# Patient Record
Sex: Male | Born: 1965
Health system: Southern US, Community
[De-identification: ages and names within clinical notes are randomized; demographics above are authoritative.]

## PROBLEM LIST (undated history)

## (undated) ENCOUNTER — Emergency Department (HOSPITAL_COMMUNITY): Payer: No Typology Code available for payment source | Source: Home / Self Care

## (undated) DIAGNOSIS — I10 Essential (primary) hypertension: Secondary | ICD-10-CM

---

## 2004-06-04 ENCOUNTER — Emergency Department (HOSPITAL_COMMUNITY): Admission: EM | Admit: 2004-06-04 | Discharge: 2004-06-04 | Payer: Self-pay | Admitting: Emergency Medicine

## 2005-02-13 ENCOUNTER — Emergency Department (HOSPITAL_COMMUNITY): Admission: EM | Admit: 2005-02-13 | Discharge: 2005-02-13 | Payer: Self-pay | Admitting: Family Medicine

## 2009-01-09 ENCOUNTER — Inpatient Hospital Stay (HOSPITAL_COMMUNITY): Admission: EM | Admit: 2009-01-09 | Discharge: 2009-01-20 | Payer: Self-pay | Admitting: Emergency Medicine

## 2009-01-09 ENCOUNTER — Ambulatory Visit: Payer: Self-pay | Admitting: Family Medicine

## 2009-01-10 ENCOUNTER — Encounter (INDEPENDENT_AMBULATORY_CARE_PROVIDER_SITE_OTHER): Payer: Self-pay | Admitting: General Surgery

## 2009-01-10 ENCOUNTER — Ambulatory Visit: Payer: Self-pay | Admitting: Vascular Surgery

## 2009-01-15 LAB — CONVERTED CEMR LAB
Cholesterol: 145 mg/dL
LDL Cholesterol: 86 mg/dL
Total CHOL/HDL Ratio: 4.3
Triglycerides: 127 mg/dL
VLDL: 25 mg/dL

## 2009-01-18 LAB — CONVERTED CEMR LAB
Alkaline Phosphatase: 81 units/L
CO2: 32 meq/L
Creatinine, Ser: 1.43 mg/dL
Glucose, Bld: 100 mg/dL
Sodium: 136 meq/L
Total Bilirubin: 1.8 mg/dL
Total Protein: 6.4 g/dL

## 2009-01-22 ENCOUNTER — Encounter: Payer: Self-pay | Admitting: *Deleted

## 2009-01-22 ENCOUNTER — Ambulatory Visit: Payer: Self-pay | Admitting: Family Medicine

## 2009-01-27 ENCOUNTER — Ambulatory Visit: Payer: Self-pay | Admitting: Family Medicine

## 2009-01-27 DIAGNOSIS — I2699 Other pulmonary embolism without acute cor pulmonale: Secondary | ICD-10-CM | POA: Insufficient documentation

## 2009-01-27 DIAGNOSIS — I10 Essential (primary) hypertension: Secondary | ICD-10-CM | POA: Insufficient documentation

## 2009-01-27 LAB — CONVERTED CEMR LAB: INR: 1.1

## 2009-02-01 ENCOUNTER — Ambulatory Visit: Payer: Self-pay | Admitting: Family Medicine

## 2009-02-02 ENCOUNTER — Encounter: Payer: Self-pay | Admitting: Family Medicine

## 2009-02-02 ENCOUNTER — Telehealth: Payer: Self-pay | Admitting: *Deleted

## 2009-02-03 ENCOUNTER — Encounter: Payer: Self-pay | Admitting: *Deleted

## 2009-02-04 ENCOUNTER — Ambulatory Visit: Payer: Self-pay | Admitting: Family Medicine

## 2009-02-08 ENCOUNTER — Encounter: Payer: Self-pay | Admitting: Family Medicine

## 2009-02-08 ENCOUNTER — Ambulatory Visit: Payer: Self-pay | Admitting: Family Medicine

## 2009-02-08 ENCOUNTER — Encounter (INDEPENDENT_AMBULATORY_CARE_PROVIDER_SITE_OTHER): Payer: Self-pay | Admitting: Family Medicine

## 2009-02-09 ENCOUNTER — Encounter: Payer: Self-pay | Admitting: Family Medicine

## 2009-02-12 ENCOUNTER — Ambulatory Visit: Payer: Self-pay | Admitting: Family Medicine

## 2009-02-12 LAB — CONVERTED CEMR LAB: INR: 1.5

## 2009-02-15 ENCOUNTER — Encounter (INDEPENDENT_AMBULATORY_CARE_PROVIDER_SITE_OTHER): Payer: Self-pay | Admitting: Family Medicine

## 2009-02-17 ENCOUNTER — Ambulatory Visit: Payer: Self-pay | Admitting: Family Medicine

## 2009-02-17 ENCOUNTER — Encounter: Payer: Self-pay | Admitting: Pharmacist

## 2009-02-17 LAB — CONVERTED CEMR LAB: INR: 1.7

## 2009-02-22 ENCOUNTER — Ambulatory Visit: Payer: Self-pay | Admitting: Family Medicine

## 2009-02-24 ENCOUNTER — Ambulatory Visit: Payer: Self-pay | Admitting: Family Medicine

## 2009-02-24 LAB — CONVERTED CEMR LAB: INR: 2.9

## 2009-03-04 ENCOUNTER — Ambulatory Visit: Payer: Self-pay | Admitting: Family Medicine

## 2009-03-04 LAB — CONVERTED CEMR LAB: INR: 3.7

## 2009-03-10 ENCOUNTER — Ambulatory Visit: Payer: Self-pay | Admitting: Family Medicine

## 2009-03-10 LAB — CONVERTED CEMR LAB: INR: 3.2

## 2009-03-18 ENCOUNTER — Encounter (INDEPENDENT_AMBULATORY_CARE_PROVIDER_SITE_OTHER): Payer: Self-pay | Admitting: *Deleted

## 2009-03-18 ENCOUNTER — Ambulatory Visit: Payer: Self-pay | Admitting: Family Medicine

## 2009-03-24 ENCOUNTER — Encounter: Payer: Self-pay | Admitting: Family Medicine

## 2009-03-29 ENCOUNTER — Ambulatory Visit: Payer: Self-pay | Admitting: Family Medicine

## 2009-03-29 LAB — CONVERTED CEMR LAB: INR: 1.7

## 2009-04-12 ENCOUNTER — Encounter: Payer: Self-pay | Admitting: Family Medicine

## 2009-04-12 ENCOUNTER — Ambulatory Visit: Payer: Self-pay | Admitting: Family Medicine

## 2009-04-15 ENCOUNTER — Ambulatory Visit: Payer: Self-pay | Admitting: Family Medicine

## 2009-04-15 LAB — CONVERTED CEMR LAB: INR: 2.2

## 2009-04-22 ENCOUNTER — Ambulatory Visit: Payer: Self-pay | Admitting: Family Medicine

## 2009-05-10 ENCOUNTER — Ambulatory Visit: Payer: Self-pay | Admitting: Family Medicine

## 2009-05-10 LAB — CONVERTED CEMR LAB: INR: 1.5

## 2009-05-17 ENCOUNTER — Ambulatory Visit: Payer: Self-pay | Admitting: Family Medicine

## 2009-05-17 LAB — CONVERTED CEMR LAB: INR: 2.3

## 2009-05-31 ENCOUNTER — Ambulatory Visit: Payer: Self-pay | Admitting: Family Medicine

## 2009-05-31 LAB — CONVERTED CEMR LAB: INR: 2.2

## 2009-06-14 ENCOUNTER — Ambulatory Visit: Payer: Self-pay | Admitting: Family Medicine

## 2009-07-05 ENCOUNTER — Ambulatory Visit: Payer: Self-pay | Admitting: Family Medicine

## 2009-07-26 ENCOUNTER — Ambulatory Visit: Payer: Self-pay | Admitting: Family Medicine

## 2009-07-26 ENCOUNTER — Encounter: Payer: Self-pay | Admitting: Family Medicine

## 2009-07-26 LAB — CONVERTED CEMR LAB
BUN: 19 mg/dL (ref 6–23)
CO2: 27 meq/L (ref 19–32)
Chloride: 102 meq/L (ref 96–112)
INR: 4.62 — ABNORMAL HIGH (ref <1.50)
Potassium: 4 meq/L (ref 3.5–5.3)
Prothrombin Time: 43.3 s — ABNORMAL HIGH (ref 11.6–15.2)

## 2009-08-05 ENCOUNTER — Ambulatory Visit: Payer: Self-pay | Admitting: Family Medicine

## 2009-08-05 ENCOUNTER — Encounter: Payer: Self-pay | Admitting: Family Medicine

## 2009-08-05 LAB — CONVERTED CEMR LAB
HDL: 53 mg/dL (ref >39)
LDL Cholesterol: 98 mg/dL (ref 0–99)
Triglycerides: 77 mg/dL (ref <150)
VLDL: 15 mg/dL (ref 0–40)

## 2010-05-19 ENCOUNTER — Ambulatory Visit: Payer: Self-pay | Admitting: Family Medicine

## 2010-05-19 ENCOUNTER — Encounter: Payer: Self-pay | Admitting: Family Medicine

## 2010-05-19 LAB — CONVERTED CEMR LAB
Calcium: 9.3 mg/dL (ref 8.4–10.5)
Creatinine, Ser: 1.42 mg/dL (ref 0.40–1.50)
Sodium: 142 meq/L (ref 135–145)

## 2010-05-20 ENCOUNTER — Encounter: Payer: Self-pay | Admitting: Family Medicine

## 2010-09-27 NOTE — Letter (Signed)
Summary: Out of School  [facility]  [address]   [place], [place] [address]   Phone: [phone]  Fax: [phone]    May 19, 2010   Student:  [employer]    To Whom It May Concern:   For Medical reasons, please excuse the above named student from school for the following dates:  Start:   May 19, 2010  End:    May return to school on Monday, Sept 26, 2011.  If you need additional information, please feel free to contact our office.   Sincerely,    Cat Ta MD    ****This is a legal document and cannot be tampered with.  Schools are authorized to verify all information and to do so accordingly.
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
## 2010-09-27 NOTE — Letter (Signed)
Summary: Generic Letter  [facility]  [address]   [place], [place] [address]   Phone: [phone]  Fax: [phone]    05/20/2010  [facility] [address] [place], [place]  [address]  Dear [name],  I tried to call you by phone but the phone number we have for you was disconnected.  Please call the [facility] with your new number.    I wanted to let you know that the blood test we took yesterday was normal (electrolytes and kidney function).    Continue taking your 2 blood pressure medicines Hydrochlorothiazide and Amlodipine.       Sincerely,   Erik Hilmer MD  Appended Document: Generic Letter mailed.

## 2010-11-15 ENCOUNTER — Other Ambulatory Visit: Payer: Self-pay | Admitting: Family Medicine

## 2010-12-06 LAB — CBC
HCT: 33.3 % — ABNORMAL LOW (ref 39.0–52.0)
HCT: 33.6 % — ABNORMAL LOW (ref 39.0–52.0)
HCT: 34.7 % — ABNORMAL LOW (ref 39.0–52.0)
HCT: 35.7 % — ABNORMAL LOW (ref 39.0–52.0)
HCT: 35.8 % — ABNORMAL LOW (ref 39.0–52.0)
HCT: 37 % — ABNORMAL LOW (ref 39.0–52.0)
HCT: 37.2 % — ABNORMAL LOW (ref 39.0–52.0)
HCT: 37.4 % — ABNORMAL LOW (ref 39.0–52.0)
HCT: 37.5 % — ABNORMAL LOW (ref 39.0–52.0)
HCT: 37.6 % — ABNORMAL LOW (ref 39.0–52.0)
HCT: 41 % (ref 39.0–52.0)
HCT: 41.9 % (ref 39.0–52.0)
HCT: 37.8 % — ABNORMAL LOW (ref 39.0–52.0)
Hemoglobin: 11.4 g/dL — ABNORMAL LOW (ref 13.0–17.0)
Hemoglobin: 11.7 g/dL — ABNORMAL LOW (ref 13.0–17.0)
Hemoglobin: 11.8 g/dL — ABNORMAL LOW (ref 13.0–17.0)
Hemoglobin: 12.4 g/dL — ABNORMAL LOW (ref 13.0–17.0)
Hemoglobin: 12.5 g/dL — ABNORMAL LOW (ref 13.0–17.0)
Hemoglobin: 12.6 g/dL — ABNORMAL LOW (ref 13.0–17.0)
Hemoglobin: 12.7 g/dL — ABNORMAL LOW (ref 13.0–17.0)
Hemoglobin: 12.8 g/dL — ABNORMAL LOW (ref 13.0–17.0)
Hemoglobin: 12.9 g/dL — ABNORMAL LOW (ref 13.0–17.0)
Hemoglobin: 14 g/dL (ref 13.0–17.0)
Hemoglobin: 14.1 g/dL (ref 13.0–17.0)
Hemoglobin: 12.7 g/dL — ABNORMAL LOW (ref 13.0–17.0)
Hemoglobin: 13.3 g/dL (ref 13.0–17.0)
MCHC: 33.6 g/dL (ref 30.0–36.0)
MCHC: 34 g/dL (ref 30.0–36.0)
MCHC: 34.1 g/dL (ref 30.0–36.0)
MCHC: 34.2 g/dL (ref 30.0–36.0)
MCHC: 34.2 g/dL (ref 30.0–36.0)
MCHC: 34.2 g/dL (ref 30.0–36.0)
MCHC: 34.3 g/dL (ref 30.0–36.0)
MCHC: 34.5 g/dL (ref 30.0–36.0)
MCHC: 34.6 g/dL (ref 30.0–36.0)
MCHC: 34.7 g/dL (ref 30.0–36.0)
MCHC: 34.9 g/dL (ref 30.0–36.0)
MCHC: 33.6 g/dL (ref 30.0–36.0)
MCV: 83 fL (ref 78.0–100.0)
MCV: 83.2 fL (ref 78.0–100.0)
MCV: 83.3 fL (ref 78.0–100.0)
MCV: 83.6 fL (ref 78.0–100.0)
MCV: 83.8 fL (ref 78.0–100.0)
MCV: 83.9 fL (ref 78.0–100.0)
MCV: 83.9 fL (ref 78.0–100.0)
MCV: 84 fL (ref 78.0–100.0)
MCV: 84.2 fL (ref 78.0–100.0)
MCV: 84.2 fL (ref 78.0–100.0)
MCV: 84.4 fL (ref 78.0–100.0)
MCV: 84.6 fL (ref 78.0–100.0)
MCV: 83.7 fL (ref 78.0–100.0)
MCV: 84.4 fL (ref 78.0–100.0)
Platelets: 102 10*3/uL — ABNORMAL LOW (ref 150–400)
Platelets: 145 10*3/uL — ABNORMAL LOW (ref 150–400)
Platelets: 162 10*3/uL (ref 150–400)
Platelets: 197 10*3/uL (ref 150–400)
Platelets: 199 10*3/uL (ref 150–400)
Platelets: 215 10*3/uL (ref 150–400)
Platelets: 217 10*3/uL (ref 150–400)
Platelets: 89 10*3/uL — ABNORMAL LOW (ref 150–400)
Platelets: 96 10*3/uL — ABNORMAL LOW (ref 150–400)
RBC: 3.95 MIL/uL — ABNORMAL LOW (ref 4.22–5.81)
RBC: 4.11 MIL/uL — ABNORMAL LOW (ref 4.22–5.81)
RBC: 4.31 MIL/uL (ref 4.22–5.81)
RBC: 4.32 MIL/uL (ref 4.22–5.81)
RBC: 4.38 MIL/uL (ref 4.22–5.81)
RBC: 4.4 MIL/uL (ref 4.22–5.81)
RBC: 4.44 MIL/uL (ref 4.22–5.81)
RBC: 4.44 MIL/uL (ref 4.22–5.81)
RBC: 4.51 MIL/uL (ref 4.22–5.81)
RBC: 4.53 MIL/uL (ref 4.22–5.81)
RBC: 4.48 MIL/uL (ref 4.22–5.81)
RBC: 4.67 MIL/uL (ref 4.22–5.81)
RDW: 11.7 % (ref 11.5–15.5)
RDW: 11.9 % (ref 11.5–15.5)
RDW: 12 % (ref 11.5–15.5)
RDW: 12 % (ref 11.5–15.5)
RDW: 12.1 % (ref 11.5–15.5)
RDW: 12.3 % (ref 11.5–15.5)
RDW: 12.3 % (ref 11.5–15.5)
RDW: 12.4 % (ref 11.5–15.5)
RDW: 12.5 % (ref 11.5–15.5)
RDW: 12.6 % (ref 11.5–15.5)
RDW: 12.8 % (ref 11.5–15.5)
RDW: 12.2 % (ref 11.5–15.5)
WBC: 5.3 10*3/uL (ref 4.0–10.5)
WBC: 5.8 10*3/uL (ref 4.0–10.5)
WBC: 5.9 10*3/uL (ref 4.0–10.5)
WBC: 6.1 10*3/uL (ref 4.0–10.5)
WBC: 6.2 10*3/uL (ref 4.0–10.5)
WBC: 6.4 10*3/uL (ref 4.0–10.5)
WBC: 6.6 10*3/uL (ref 4.0–10.5)
WBC: 6.8 10*3/uL (ref 4.0–10.5)
WBC: 7 10*3/uL (ref 4.0–10.5)
WBC: 14.1 10*3/uL — ABNORMAL HIGH (ref 4.0–10.5)

## 2010-12-06 LAB — DIFFERENTIAL
Lymphs Abs: 1 10*3/uL (ref 0.7–4.0)
Monocytes Absolute: 0.5 10*3/uL (ref 0.1–1.0)
Monocytes Relative: 4 % (ref 3–12)
Neutro Abs: 12.5 10*3/uL — ABNORMAL HIGH (ref 1.7–7.7)
Neutrophils Relative %: 89 % — ABNORMAL HIGH (ref 43–77)

## 2010-12-06 LAB — COMPREHENSIVE METABOLIC PANEL
Albumin: 3.2 g/dL — ABNORMAL LOW (ref 3.5–5.2)
BUN: 6 mg/dL (ref 6–23)
Calcium: 8.5 mg/dL (ref 8.4–10.5)
Chloride: 101 mEq/L (ref 96–112)
Creatinine, Ser: 1.03 mg/dL (ref 0.4–1.5)
GFR calc non Af Amer: >60 mL/min (ref >60)
Total Bilirubin: 1.8 mg/dL — ABNORMAL HIGH (ref 0.3–1.2)

## 2010-12-06 LAB — LIPID PANEL: VLDL: 25 mg/dL (ref 0–40)

## 2010-12-06 LAB — LUPUS ANTICOAGULANT PANEL: PTTLA 4:1 Mix: 53.7 secs — ABNORMAL HIGH (ref 36.3–48.8)

## 2010-12-06 LAB — BASIC METABOLIC PANEL
BUN: 16 mg/dL (ref 6–23)
CO2: 28 mEq/L (ref 19–32)
CO2: 32 mEq/L (ref 19–32)
Calcium: 8.8 mg/dL (ref 8.4–10.5)
Calcium: 9 mg/dL (ref 8.4–10.5)
Calcium: 8.5 mg/dL (ref 8.4–10.5)
Chloride: 109 mEq/L (ref 96–112)
Chloride: 96 mEq/L (ref 96–112)
Chloride: 109 mEq/L (ref 96–112)
Creatinine, Ser: 1.27 mg/dL (ref 0.4–1.5)
Creatinine, Ser: 1.62 mg/dL — ABNORMAL HIGH (ref 0.4–1.5)
Creatinine, Ser: 1.15 mg/dL (ref 0.4–1.5)
GFR calc Af Amer: 57 mL/min — ABNORMAL LOW (ref >60)
GFR calc Af Amer: >60 mL/min (ref >60)
GFR calc Af Amer: >60 mL/min (ref >60)
GFR calc Af Amer: >60 mL/min (ref >60)
GFR calc non Af Amer: 55 mL/min — ABNORMAL LOW (ref >60)
GFR calc non Af Amer: >60 mL/min (ref >60)
Glucose, Bld: 100 mg/dL — ABNORMAL HIGH (ref 70–99)
Glucose, Bld: 102 mg/dL — ABNORMAL HIGH (ref 70–99)
Potassium: 3.4 mEq/L — ABNORMAL LOW (ref 3.5–5.1)
Potassium: 4 mEq/L (ref 3.5–5.1)
Sodium: 136 mEq/L (ref 135–145)
Sodium: 138 mEq/L (ref 135–145)
Sodium: 141 mEq/L (ref 135–145)

## 2010-12-06 LAB — PROTEIN S ACTIVITY: Protein S Activity: 97 % (ref 69–129)

## 2010-12-06 LAB — BETA-2-GLYCOPROTEIN I ABS, IGG/M/A
Beta-2 Glyco I IgG: <4 U/mL (ref <20)
Beta-2-Glycoprotein I IgA: 4 U/mL (ref <10)

## 2010-12-06 LAB — PROTIME-INR
INR: 1.1 (ref 0.00–1.49)
INR: 2.1 — ABNORMAL HIGH (ref 0.00–1.49)
INR: 2.3 — ABNORMAL HIGH (ref 0.00–1.49)
Prothrombin Time: 15.8 seconds — ABNORMAL HIGH (ref 11.6–15.2)
Prothrombin Time: 19.2 seconds — ABNORMAL HIGH (ref 11.6–15.2)
Prothrombin Time: 24.6 seconds — ABNORMAL HIGH (ref 11.6–15.2)

## 2010-12-06 LAB — AMMONIA: Ammonia: 22 umol/L (ref 11–35)

## 2010-12-06 LAB — FACTOR 5 LEIDEN

## 2010-12-06 LAB — CARDIOLIPIN ANTIBODIES, IGG, IGM, IGA: Anticardiolipin IgG: <7 [GPL'U] — ABNORMAL LOW (ref <11)

## 2010-12-06 LAB — ETHANOL: Alcohol, Ethyl (B): 140 mg/dL — ABNORMAL HIGH (ref 0–10)

## 2010-12-06 LAB — ANTITHROMBIN III: AntiThromb III Func: 124 % — ABNORMAL HIGH (ref 76–126)
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
## 2012-03-25 ENCOUNTER — Telehealth: Payer: Self-pay | Admitting: Family Medicine

## 2012-03-25 NOTE — Telephone Encounter (Signed)
Pt is out of BP meds. Has an appt to meet new doc on 04/05/12. Needs enough to take until appt.

## 2012-03-25 NOTE — Telephone Encounter (Signed)
05/19/2010 was last visit in [facility].  Patient was only given a 4 month supply at that time and as far as I can tell has had no other refills.  Please call pt and ask if he was being seen by a different provider in the interim (since 2011).  If so, I will fill x11 days so he does not run out, otherwise, I will fill at his appt on 8/9. I doubt that he has had medications in >1 year Margaretmary Bayley has had a different PCP.  Thanks!

## 2012-03-25 NOTE — Telephone Encounter (Signed)
Forward to PCP for refill request, I do not see where patient has been seen here before.Busick, Rodena Medin

## 2012-03-26 NOTE — Telephone Encounter (Signed)
Spoke with Dr Fara Boros she says patient needs to keep his appointment with her since he has not been seen here in a couple years and the BP med was last filled in Feb of 2012. Called and informed patient of this and also to go to a pharmacy to have his BP checked to make sure it is not to elevated, if so to let us know. Patient agreed with this.Busick, Rodena Medin

## 2012-04-05 ENCOUNTER — Ambulatory Visit (INDEPENDENT_AMBULATORY_CARE_PROVIDER_SITE_OTHER): Payer: 59 | Admitting: Family Medicine

## 2012-04-05 ENCOUNTER — Encounter: Payer: Self-pay | Admitting: Family Medicine

## 2012-04-05 VITALS — BP 158/106 | HR 72 | Ht 73.5 in | Wt 253.7 lb

## 2012-04-05 DIAGNOSIS — I10 Essential (primary) hypertension: Secondary | ICD-10-CM

## 2012-04-05 DIAGNOSIS — Z23 Encounter for immunization: Secondary | ICD-10-CM

## 2012-04-05 NOTE — Progress Notes (Signed)
S: Pt comes in today for follow up, med refill.  HYPERTENSION BP: 128/106 Meds: amlodipine 5mg , HCTZ 25mg  Taking meds: No    # of doses missed/week: all; has been out x a few weeks Symptoms: Headache: No Dizziness: No Vision changes: No SOB:  No Chest pain: No LE swelling: No Tobacco use: No  HEADACHE Had a headache a few weeks ago, is thinking it was b/c of his BP.  No problems since then.  Didn't have dizziness or syncope.  No other symptoms (no weakness, numbness, etc).    ROS: Per HPI  History  Smoking status  . Never Smoker   Smokeless tobacco  . Not on file    O:  Filed Vitals:   04/05/12 1551  BP: 158/106  Pulse: 72    Gen: NAD CV: RRR, no murmur Pulm: CTA bilat, no wheezes or crackles Abd: soft, NT Ext: Warm, no chronic skin changes, no edema   A/P: 46 y.o. male p/w HTN -See problem list -f/u in 1 month

## 2012-04-05 NOTE — Patient Instructions (Addendum)
It was nice to meet you.  We will check a lab today, to make sure your kidneys are working well.  We will restart one of your medicines first (HCTZ).  Come back in a few weeks (3-4) and we will recheck your blood pressure; if it's still high we will readd the norvasc.

## 2012-04-06 ENCOUNTER — Encounter: Payer: Self-pay | Admitting: Family Medicine

## 2012-04-06 LAB — BASIC METABOLIC PANEL
BUN: 15 mg/dL (ref 6–23)
Calcium: 9.3 mg/dL (ref 8.4–10.5)
Creat: 1.5 mg/dL — ABNORMAL HIGH (ref 0.50–1.35)
Glucose, Bld: 93 mg/dL (ref 70–99)

## 2012-04-06 MED ORDER — HYDROCHLOROTHIAZIDE 25 MG PO TABS: 25.0000 mg | ORAL_TABLET | Freq: Every day | ORAL | Status: DC

## 2012-04-06 NOTE — Assessment & Plan Note (Signed)
Elevated today. Will restart HCTZ. Check BMET.  Was also on norvasc previously, so could restart if need additional BP control.  BP Readings from Last 3 Encounters:  04/05/12 158/106  05/19/10 153/102  07/26/09 147/97    04/06/12-- BMET resulted, Cr slightly elevated.  Called pt and advised him to stop all NSAIDs and increase po fluids.  Will recheck BMET at f/u visit.

## 2012-05-09 ENCOUNTER — Encounter: Payer: Self-pay | Admitting: Family Medicine

## 2012-05-09 ENCOUNTER — Ambulatory Visit (INDEPENDENT_AMBULATORY_CARE_PROVIDER_SITE_OTHER): Payer: 59 | Admitting: Family Medicine

## 2012-05-09 VITALS — BP 140/89 | HR 84 | Ht 73.5 in | Wt 253.0 lb

## 2012-05-09 DIAGNOSIS — N529 Male erectile dysfunction, unspecified: Secondary | ICD-10-CM | POA: Insufficient documentation

## 2012-05-09 DIAGNOSIS — I10 Essential (primary) hypertension: Secondary | ICD-10-CM

## 2012-05-09 DIAGNOSIS — N179 Acute kidney failure, unspecified: Secondary | ICD-10-CM

## 2012-05-09 LAB — BASIC METABOLIC PANEL
CO2: 25 mEq/L (ref 19–32)
Chloride: 105 mEq/L (ref 96–112)
Potassium: 4 mEq/L (ref 3.5–5.3)
Sodium: 140 mEq/L (ref 135–145)

## 2012-05-09 MED ORDER — SILDENAFIL CITRATE 25 MG PO TABS: 25.0000 mg | ORAL_TABLET | Freq: Every day | ORAL | Status: DC | PRN

## 2012-05-09 NOTE — Assessment & Plan Note (Signed)
Technically, BP right around goal of <140/90.  If Cr remains elevated, new goal would be <130/80.  Pt reports some non-compliance with HCTZ and no salt restriction.  Would like to try 3-4 weeks of med compliance and dietary changes before re-adding amlodipine 5mg .

## 2012-05-09 NOTE — Assessment & Plan Note (Signed)
Will recheck BMET today and see if Cr has improved.  Appears to have worsened over time (1.50 on 03/2012, 1.42 in 04/2010, 1.20 in 06/2009).  This may actually be CKD.

## 2012-05-09 NOTE — Assessment & Plan Note (Signed)
Pt requesting medical assistance in getting erection.  Only significant cardiac history is HTN.  Will try Viagra.

## 2012-05-09 NOTE — Progress Notes (Signed)
S: Pt comes in today for follow up.  HYPERTENSION BP: 140/89 Meds: HCTZ 25 (previously on amlodipine 5, not yet restarted) Taking meds: Yes     # of doses missed/week: 3 Symptoms: Headache: No Dizziness: No Vision changes: No SOB:  No Chest pain: Yes : MSK LE swelling: No Tobacco use: No   ELEVATED CR/AKI Found on labs.  Encouraged pt to avoid NSAIDs and increase po fluids, which he has done.  No issues with urination.   ERECTILE DYSFUNCTION Has been an issue for months.  Having difficulty with getting erection started but once has one, he is able to maintain it and it is good enough for penetration.  Is interested in sexual activity.  Would like to know if something like Viagra would be appropriate for him.    ROS: Per HPI  History  Smoking status  . Never Smoker   Smokeless tobacco  . Not on file    O:  Filed Vitals:   05/09/12 1612  BP: 140/89  Pulse: 84    Gen: NAD CV: RRR, no murmur Pulm: CTA bilat, no wheezes or crackles Ext: Warm, no edema   A/P: 46 y.o. male p/w HTN, AKI, ED -See problem list -f/u in 3-4 weeks for BP recheck

## 2012-05-09 NOTE — Patient Instructions (Addendum)
It was good to see you today.  Your blood pressure is right around our goal of less than 140/90 (it was 140/89 today) on just the HCTZ.  We decided to try to be better about taking the medicine every day and avoiding salt before re-adding amlodipine.   We are rechecking your kidneys today since they were not working at 100% on the last lab check.  I will let you know the results.  I have sent in a prescription for Viagra.  Take 1 pill before sexual activity.  If 1 is not enough after trying it 1-2 times, you can try 2 at a time.   Come back and see me in about 3-4 weeks so we can recheck your blood pressure.

## 2012-05-10 ENCOUNTER — Telehealth: Payer: Self-pay | Admitting: Family Medicine

## 2012-05-10 DIAGNOSIS — I10 Essential (primary) hypertension: Secondary | ICD-10-CM

## 2012-05-10 DIAGNOSIS — N179 Acute kidney failure, unspecified: Secondary | ICD-10-CM

## 2012-05-10 NOTE — Telephone Encounter (Signed)
Called patient and read message from Dr Fara Boros. Scheduled him a lab appointment for 05/16/12.Brandilynn Taormina, Rodena Medin

## 2012-05-10 NOTE — Telephone Encounter (Signed)
Please call patient and ask him to come in for a LAB ONLY appointment next week for a urine and blood sample.  His kidney function did not improve despite encouraging him to avoid NSAIDs and drink plenty of water and with his blood pressure doing better.  It actually worsened.  Please tell him to continue taking his BP medication and to continue to stay well hydrated and avoid NSAIDs (including motrin, Aleve, goody's powder).  Please double check that he is peeing ok (I asked him yesterday and he reported no problems). Thanks!

## 2012-05-15 ENCOUNTER — Other Ambulatory Visit: Payer: 59

## 2012-05-15 DIAGNOSIS — N179 Acute kidney failure, unspecified: Secondary | ICD-10-CM

## 2012-05-15 DIAGNOSIS — I10 Essential (primary) hypertension: Secondary | ICD-10-CM

## 2012-05-15 LAB — POCT URINALYSIS DIPSTICK
Bilirubin, UA: NEGATIVE
Blood, UA: NEGATIVE
Glucose, UA: NEGATIVE
Nitrite, UA: NEGATIVE
Spec Grav, UA: >=1.03

## 2012-05-15 LAB — BASIC METABOLIC PANEL
BUN: 20 mg/dL (ref 6–23)
CO2: 29 mEq/L (ref 19–32)
Chloride: 104 mEq/L (ref 96–112)
Glucose, Bld: 96 mg/dL (ref 70–99)
Potassium: 3.9 mEq/L (ref 3.5–5.3)

## 2012-05-15 NOTE — Progress Notes (Signed)
BMP AND UA DONE TODAY [name] 

## 2012-05-16 ENCOUNTER — Other Ambulatory Visit: Payer: 59

## 2012-05-16 ENCOUNTER — Encounter: Payer: Self-pay | Admitting: Family Medicine

## 2012-06-12 ENCOUNTER — Ambulatory Visit (INDEPENDENT_AMBULATORY_CARE_PROVIDER_SITE_OTHER): Payer: 59 | Admitting: Family Medicine

## 2012-06-12 ENCOUNTER — Encounter: Payer: Self-pay | Admitting: Family Medicine

## 2012-06-12 VITALS — BP 122/88 | HR 76 | Ht 73.5 in | Wt 250.0 lb

## 2012-06-12 DIAGNOSIS — M7710 Lateral epicondylitis, unspecified elbow: Secondary | ICD-10-CM

## 2012-06-12 DIAGNOSIS — I10 Essential (primary) hypertension: Secondary | ICD-10-CM

## 2012-06-12 DIAGNOSIS — N179 Acute kidney failure, unspecified: Secondary | ICD-10-CM

## 2012-06-12 LAB — BASIC METABOLIC PANEL
BUN: 16 mg/dL (ref 6–23)
Glucose, Bld: 90 mg/dL (ref 70–99)
Potassium: 4.1 mEq/L (ref 3.5–5.3)

## 2012-06-12 NOTE — Assessment & Plan Note (Signed)
May have component of CKD given patient's long-standing, poorly controlled HTN.  Cr trend: 04/2010: 1.42 04/05/12: 1.50 HCTZ restarted 05/09/12: 1.65 05/15/12: 1.48; UA neg for prot, RBC, WBC  Recheck BMET today

## 2012-06-12 NOTE — Assessment & Plan Note (Addendum)
On HCTZ 25, reports compliance to med and trying to salt restrict diet. BP well controlled today, no changes. F/u 1 month.  If remains improved will space to q3 mo visits; if elevated will add back norvasc 5mg .

## 2012-06-12 NOTE — Assessment & Plan Note (Signed)
D/w Dr. Jennette Kettle. Will send to [facility] for U/S +/- injection.  Aspercreme PRN until appt.

## 2012-06-12 NOTE — Patient Instructions (Addendum)
It was good to see you today.  We're checking your kidney function again.  It had improved last time; hopefully it will continue to improve or at least stay stable.  I will call if it is worse.  Continue to drink plenty of water and avoid medicines like motrin, Aleve, Goody powder, etc.   Your blood pressure looks great! No changes!  Make sure you are taking your HCTZ every day.  You can use some Aspercreme (over the counter) on that elbow until you are seen by Sports Medicine.  Make an appointment so that they can look at your elbow with the ultrasound- you can make it up front on your way out or call [phone].    Come back to see me in 1 month.  Once your blood pressure has looked good a few times in a row you won't need to come as often.

## 2012-06-12 NOTE — Progress Notes (Signed)
S: Pt comes in today for follow up.  HYPERTENSION BP: 122/88 Meds: HCTZ 25mg  (previously on norvasc 5) Taking meds: Yes     # of doses missed/week: 0 Symptoms: Headache: Yes- 1 a few weeks ago Dizziness: No Vision changes: No SOB:  No Chest pain: No LE swelling: No Tobacco use: No Diet: decreasing amount of salt he's using it   AKI May have component of CKD given patient's long-standing, poorly controlled HTN.  Reports no issues with urination, no obstructive symptoms. No gross hematuria or dysuria.  Cr trend: 04/2010: 1.42 04/05/12: 1.50 HCTZ restarted 05/09/12: 1.65 05/15/12: 1.48; UA neg for prot, RBC, WBC   ELBOW PAIN  Now with tenderness to palpation over the bone.  Sometimes wakes up with pain across his antecubital fossa. No activity seems to make the pain worse. No redness or swelling. No fevers. No tenderness other than over one pinpoint spot.  Feels like it is slowly worsening. No weakness in arm or hand. 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    ROS: Per HPI  History  Smoking status  . Never Smoker   Smokeless tobacco  . Not on file    O:  Filed Vitals:   06/12/12 1643  BP: 122/88  Pulse: 76    Gen: NAD CV: RRR, no murmur Pulm: CTA bilat, no wheezes or crackles Ext: Warm, no edema L elbow: no edema or erythema, + TTP over lateral epicondyle, no other tenderness, full ROM   A/P: 46 y.o. male p/w HTN, AKI vs CKD, lateral epicondylitis  -See problem list -f/u in 1 month

## 2012-06-13 ENCOUNTER — Encounter: Payer: Self-pay | Admitting: Family Medicine

## 2012-11-15 ENCOUNTER — Other Ambulatory Visit: Payer: Self-pay | Admitting: Family Medicine

## 2012-11-15 NOTE — Telephone Encounter (Signed)
NO further refills until has appt with PCP.

## 2012-11-20 ENCOUNTER — Other Ambulatory Visit: Payer: Self-pay | Admitting: Family Medicine

## 2012-11-20 NOTE — Telephone Encounter (Signed)
Willing to refill once pt schedules an appt for f/u.  Please call pt and try to get him in to see me.  Then I will refill HCTZ.  I have not seen him since 05/2012 and he had some kidney damage at that time that needs to be followed closely with the blood pressure medicine he is on.

## 2012-11-21 NOTE — Telephone Encounter (Signed)
Will forward to Dr McGill 

## 2012-11-21 NOTE — Telephone Encounter (Signed)
LMOM advising pt to rt call to sched OV and let us know so that Dr Fara Boros can RF med.

## 2012-11-21 NOTE — Telephone Encounter (Signed)
Pt has made appt for next Thurs - wants to know if he can get refill on his BP meds until then

## 2012-11-21 NOTE — Telephone Encounter (Signed)
Advised pt of med at pharmacy per Dr Fara Boros.

## 2012-11-28 ENCOUNTER — Encounter: Payer: Self-pay | Admitting: Family Medicine

## 2012-11-28 ENCOUNTER — Ambulatory Visit (INDEPENDENT_AMBULATORY_CARE_PROVIDER_SITE_OTHER): Payer: 59 | Admitting: Family Medicine

## 2012-11-28 VITALS — BP 145/87 | HR 81 | Ht 73.5 in | Wt 255.0 lb

## 2012-11-28 DIAGNOSIS — N189 Chronic kidney disease, unspecified: Secondary | ICD-10-CM | POA: Insufficient documentation

## 2012-11-28 DIAGNOSIS — I10 Essential (primary) hypertension: Secondary | ICD-10-CM

## 2012-11-28 MED ORDER — HYDROCHLOROTHIAZIDE 25 MG PO TABS: ORAL_TABLET | ORAL | Status: DC

## 2012-11-28 NOTE — Assessment & Plan Note (Signed)
Defer BMET to next month per pt request, anticipate Cr to be at baseline of 1.5

## 2012-11-28 NOTE — Assessment & Plan Note (Signed)
Elevated today but out of med x1 week. Refilled HCTZ 25mg .  F/u 1 month.  If still elevated, will readd norvasc 5mg , otherwise will space visits.  BP Readings from Last 3 Encounters:  11/28/12 145/87  06/12/12 122/88  05/09/12 140/89

## 2012-11-28 NOTE — Patient Instructions (Addendum)
It was good to see you today.  I am refilling your blood pressure medicine.  I will see you back in 1 month, and we will check your kidney function then.

## 2012-11-28 NOTE — Progress Notes (Signed)
S: Pt comes in today for follow up.  HYPERTENSION BP: 145/87 Meds: HCTZ 25 (previously also on norvasc 5) Taking meds: Yes : but has been out for 1 week    # of doses missed/week: 0 Symptoms: Headache: No Dizziness: No Vision changes: No SOB:  No Chest pain: No LE swelling: No Tobacco use: No   CKD: baseline Cr ~ 1.4-1.5; most recently 1.43 on 05/2012 with calculated eGFR 69.    ROS: Per HPI  History  Smoking status  . Never Smoker   Smokeless tobacco  . Not on file    O:  Filed Vitals:   11/28/12 1532  BP: 145/87  Pulse: 81    Gen: NAD CV: RRR, no murmur Pulm: CTA bilat, no wheezes or crackles Ext: Warm, no edema   A/P: 47 y.o. male p/w HTN, CKD -See problem list -f/u in 1 month

## 2012-12-30 ENCOUNTER — Ambulatory Visit (INDEPENDENT_AMBULATORY_CARE_PROVIDER_SITE_OTHER): Payer: 59 | Admitting: Family Medicine

## 2012-12-30 ENCOUNTER — Encounter: Payer: Self-pay | Admitting: Family Medicine

## 2012-12-30 VITALS — BP 134/84 | HR 84 | Ht 73.5 in | Wt 249.0 lb

## 2012-12-30 DIAGNOSIS — N189 Chronic kidney disease, unspecified: Secondary | ICD-10-CM

## 2012-12-30 DIAGNOSIS — I10 Essential (primary) hypertension: Secondary | ICD-10-CM

## 2012-12-30 LAB — BASIC METABOLIC PANEL
BUN: 21 mg/dL (ref 6–23)
Calcium: 9.7 mg/dL (ref 8.4–10.5)
Glucose, Bld: 83 mg/dL (ref 70–99)
Sodium: 140 mEq/L (ref 135–145)

## 2012-12-30 MED ORDER — HYDROCHLOROTHIAZIDE 25 MG PO TABS: ORAL_TABLET | ORAL | Status: DC

## 2012-12-30 NOTE — Assessment & Plan Note (Signed)
Well controlled back on HCTZ 25. Check BMET for Cr.

## 2012-12-30 NOTE — Progress Notes (Signed)
S: Pt comes in today for follow up.  HYPERTENSION BP: 134/84 Meds: HCTZ 25 (previously also on norvasc 5) Taking meds: Yes     # of doses missed/week: 0 Symptoms: Headache: Yes : occasional- one every 1-2 weeks Dizziness: No Vision changes: No SOB:  No Chest pain: No LE swelling: No Tobacco use: No    ROS: Per HPI  History  Smoking status  . Never Smoker   Smokeless tobacco  . Not on file    O:  Filed Vitals:   12/30/12 1622  BP: 134/84  Pulse: 84    Gen: NAD CV: RRR, no murmur Pulm: CTA bilat, no wheezes or crackles Ext: Warm,no edema   A/P: 47 y.o. male p/w HTN, CKD -See problem list -f/u in 3-6 months

## 2012-12-30 NOTE — Patient Instructions (Signed)
It was good to see you today.   Your blood pressure looks better, so no changes to your medicine.  We are checking your kidneys today-- I'll send you a letter with the results.  Come back in 3-6 months.

## 2012-12-31 ENCOUNTER — Encounter: Payer: Self-pay | Admitting: Family Medicine

## 2013-01-08 HISTORY — PX: ENDOVENOUS ABLATION SAPHENOUS VEIN W/ LASER: SUR449

## 2013-01-30 ENCOUNTER — Encounter: Payer: Self-pay | Admitting: Family Medicine

## 2014-01-26 ENCOUNTER — Other Ambulatory Visit: Payer: Self-pay | Admitting: Family Medicine

## 2014-08-26 ENCOUNTER — Telehealth: Payer: Self-pay | Admitting: *Deleted

## 2014-08-26 NOTE — Telephone Encounter (Signed)
LVM for pt to return call to see about scheduling a nurse visit for flu shot. [name], [name]

## 2014-10-21 ENCOUNTER — Encounter: Payer: Self-pay | Admitting: Family Medicine

## 2014-10-21 ENCOUNTER — Ambulatory Visit (INDEPENDENT_AMBULATORY_CARE_PROVIDER_SITE_OTHER): Payer: BLUE CROSS/BLUE SHIELD | Admitting: Family Medicine

## 2014-10-21 VITALS — BP 154/96 | HR 60 | Temp 98.1°F | Ht 74.0 in | Wt 251.0 lb

## 2014-10-21 DIAGNOSIS — N183 Chronic kidney disease, stage 3 unspecified: Secondary | ICD-10-CM

## 2014-10-21 DIAGNOSIS — Z0000 Encounter for general adult medical examination without abnormal findings: Secondary | ICD-10-CM | POA: Diagnosis not present

## 2014-10-21 DIAGNOSIS — I10 Essential (primary) hypertension: Secondary | ICD-10-CM

## 2014-10-21 LAB — CBC
HCT: 39.2 % (ref 39.0–52.0)
HEMOGLOBIN: 12.9 g/dL — AB (ref 13.0–17.0)
MCH: 27.3 pg (ref 26.0–34.0)
MCHC: 32.9 g/dL (ref 30.0–36.0)
MCV: 83.1 fL (ref 78.0–100.0)
MPV: 10.8 fL (ref 8.6–12.4)
PLATELETS: 284 10*3/uL (ref 150–400)
RBC: 4.72 MIL/uL (ref 4.22–5.81)
RDW: 12.8 % (ref 11.5–15.5)
WBC: 6.1 10*3/uL (ref 4.0–10.5)

## 2014-10-21 LAB — COMPREHENSIVE METABOLIC PANEL
ALBUMIN: 4.2 g/dL (ref 3.5–5.2)
ALT: 22 U/L (ref 0–53)
AST: 22 U/L (ref 0–37)
Alkaline Phosphatase: 88 U/L (ref 39–117)
BUN: 16 mg/dL (ref 6–23)
CALCIUM: 8.9 mg/dL (ref 8.4–10.5)
CHLORIDE: 103 meq/L (ref 96–112)
CO2: 25 mEq/L (ref 19–32)
Creat: 1.52 mg/dL — ABNORMAL HIGH (ref 0.50–1.35)
GLUCOSE: 102 mg/dL — AB (ref 70–99)
POTASSIUM: 4 meq/L (ref 3.5–5.3)
Sodium: 138 mEq/L (ref 135–145)
Total Bilirubin: 1 mg/dL (ref 0.2–1.2)
Total Protein: 7.3 g/dL (ref 6.0–8.3)

## 2014-10-21 LAB — LDL CHOLESTEROL, DIRECT: Direct LDL: 75 mg/dL

## 2014-10-21 MED ORDER — LISINOPRIL-HYDROCHLOROTHIAZIDE 10-12.5 MG PO TABS: 1.0000 | ORAL_TABLET | Freq: Every day | ORAL | Status: DC

## 2014-10-21 NOTE — Assessment & Plan Note (Addendum)
CKD stage IIIa from his last creatinine almost 2 years ago, GFR 59 Discussed avoiding NSAIDs, plenty of fluids within Starting lisinopril today Checking labs, repeat BMP next visit with ACEi

## 2014-10-21 NOTE — Assessment & Plan Note (Signed)
HIV screening 

## 2014-10-21 NOTE — Patient Instructions (Addendum)
Great to meet you!  I will call or send a letter with your labs  Start the hypertension medicine.   Follow up in 1 month  Hypertension Hypertension, commonly called high blood pressure, is when the force of blood pumping through your arteries is too strong. Your arteries are the blood vessels that carry blood from your heart throughout your body. A blood pressure reading consists of a higher number over a lower number, such as 110/72. The higher number (systolic) is the pressure inside your arteries when your heart pumps. The lower number (diastolic) is the pressure inside your arteries when your heart relaxes. Ideally you want your blood pressure below 120/80. Hypertension forces your heart to work harder to pump blood. Your arteries may become narrow or stiff. Having hypertension puts you at risk for heart disease, stroke, and other problems.  RISK FACTORS Some risk factors for high blood pressure are controllable. Others are not.  Risk factors you cannot control include:   Race. You may be at higher risk if you are African American.  Age. Risk increases with age.  Gender. Men are at higher risk than women before age 47 years. After age 47, women are at higher risk than men. Risk factors you can control include:  Not getting enough exercise or physical activity.  Being overweight.  Getting too much fat, sugar, calories, or salt in your diet.  Drinking too much alcohol. SIGNS AND SYMPTOMS Hypertension does not usually cause signs or symptoms. Extremely high blood pressure (hypertensive crisis) may cause headache, anxiety, shortness of breath, and nosebleed. DIAGNOSIS  To check if you have hypertension, your health care provider will measure your blood pressure while you are seated, with your arm held at the level of your heart. It should be measured at least twice using the same arm. Certain conditions can cause a difference in blood pressure between your right and left arms. A blood  pressure reading that is higher than normal on one occasion does not mean that you need treatment. If one blood pressure reading is high, ask your health care provider about having it checked again. TREATMENT  Treating high blood pressure includes making lifestyle changes and possibly taking medicine. Living a healthy lifestyle can help lower high blood pressure. You may need to change some of your habits. Lifestyle changes may include:  Following the DASH diet. This diet is high in fruits, vegetables, and whole grains. It is low in salt, red meat, and added sugars.  Getting at least 2 hours of brisk physical activity every week.  Losing weight if necessary.  Not smoking.  Limiting alcoholic beverages.  Learning ways to reduce stress. If lifestyle changes are not enough to get your blood pressure under control, your health care provider may prescribe medicine. You may need to take more than one. Work closely with your health care provider to understand the risks and benefits. HOME CARE INSTRUCTIONS  Have your blood pressure rechecked as directed by your health care provider.   Take medicines only as directed by your health care provider. Follow the directions carefully. Blood pressure medicines must be taken as prescribed. The medicine does not work as well when you skip doses. Skipping doses also puts you at risk for problems.   Do not smoke.   Monitor your blood pressure at home as directed by your health care provider. SEEK MEDICAL CARE IF:   You think you are having a reaction to medicines taken.  You have recurrent headaches or feel  dizzy.  You have swelling in your ankles.  You have trouble with your vision. SEEK IMMEDIATE MEDICAL CARE IF:  You develop a severe headache or confusion.  You have unusual weakness, numbness, or feel faint.  You have severe chest or abdominal pain.  You vomit repeatedly.  You have trouble breathing. MAKE SURE YOU:   Understand  these instructions.  Will watch your condition.  Will get help right away if you are not doing well or get worse. Document Released: 08/14/2005 Document Revised: 12/29/2013 Document Reviewed: 06/06/2013 [facility] Patient Information 2015 [place], [place]. This information is not intended to replace advice given to you by your health care provider. Make sure you discuss any questions you have with your health care provider.

## 2014-10-21 NOTE — Progress Notes (Signed)
Patient ID: [name], male   DOB: 11/28/1965, 49 y.o.   MRN: [id number]   HPI  Patient presents today for follow-up hypertension  Patient explains that he history of hypertension does not have his medications for probably a year. has not been checking his blood pressure He doesn't exercise Does not watch his diet, frequently eats fried and fatty foods Previously  On HCTZ without problems States he's had intermittent mild headaches, worse with stress. No chest pain, dyspnea, palpitations, leg edema  Nonsmoker  Chronic kidney disease Is aware of his kidney disease, takes mostly Tylenol for pain, has used mobic intermittently for pain  Smoking status noted ROS: Per HPI  Objective: BP 154/96 mmHg  Pulse 60  Temp(Src) 98.1 F (36.7 C) (Oral)  Ht 6\' 2"  (1.88 m)  Wt 251 lb (113.853 kg)  BMI 32.21 kg/m2 Gen: NAD, alert, cooperative with exam HEENT: NCAT, EOMI CV: RRR, good S1/S2, no murmur Resp: CTABL, no wheezes, non-labored Ext: No edema, warm Neuro: Alert and oriented, No gross deficits  Assessment and plan:  Healthcare maintenance HIV screening   ESSENTIAL HYPERTENSION, BENIGN Uncontrolled hypertension, has not had his medications in over year Considering previous renal labs will go ahead and start him on lisinopril and hydrochlorothiazide at low doses Check labs today   Chronic kidney disease CKD stage IIIa from his last creatinine almost 2 years ago, GFR 59 Discussed avoiding NSAIDs, plenty of fluids within Starting lisinopril today Checking labs, repeat BMP next visit with ACEi    Orders Placed This Encounter  Procedures  . Comprehensive metabolic panel  . CBC  . LDL cholesterol, direct  . HIV antibody (with reflex)    Meds ordered this encounter  Medications  . lisinopril-hydrochlorothiazide (PRINZIDE,ZESTORETIC) 10-12.5 MG per tablet    Sig: Take 1 tablet by mouth daily.    Dispense:  30 tablet    Refill:  3

## 2014-10-21 NOTE — Assessment & Plan Note (Signed)
Uncontrolled hypertension, has not had his medications in over year Considering previous renal labs will go ahead and start him on lisinopril and hydrochlorothiazide at low doses Check labs today

## 2014-10-22 ENCOUNTER — Encounter: Payer: Self-pay | Admitting: Family Medicine

## 2014-10-22 LAB — HIV ANTIBODY (ROUTINE TESTING W REFLEX): HIV 1&2 Ab, 4th Generation: NONREACTIVE

## 2014-11-18 ENCOUNTER — Ambulatory Visit: Payer: BLUE CROSS/BLUE SHIELD | Admitting: Family Medicine

## 2014-12-02 ENCOUNTER — Encounter: Payer: Self-pay | Admitting: Family Medicine

## 2014-12-02 ENCOUNTER — Ambulatory Visit (INDEPENDENT_AMBULATORY_CARE_PROVIDER_SITE_OTHER): Payer: BLUE CROSS/BLUE SHIELD | Admitting: Family Medicine

## 2014-12-02 VITALS — BP 142/81 | HR 63 | Temp 98.4°F | Ht 74.0 in | Wt 247.0 lb

## 2014-12-02 DIAGNOSIS — N179 Acute kidney failure, unspecified: Secondary | ICD-10-CM

## 2014-12-02 DIAGNOSIS — I10 Essential (primary) hypertension: Secondary | ICD-10-CM | POA: Diagnosis not present

## 2014-12-02 DIAGNOSIS — G44209 Tension-type headache, unspecified, not intractable: Secondary | ICD-10-CM

## 2014-12-02 LAB — TSH: TSH: 0.836 u[IU]/mL (ref 0.350–4.500)

## 2014-12-02 MED ORDER — LISINOPRIL-HYDROCHLOROTHIAZIDE 10-12.5 MG PO TABS: 1.0000 | ORAL_TABLET | Freq: Every day | ORAL | Status: DC

## 2014-12-02 NOTE — Progress Notes (Signed)
Patient ID: [name], male   DOB: 09/07/1965, 49 y.o.   MRN: [id number]   HPI  Patient presents today for  Follow-up hypertension  Hypertension Taking meds everyday, has been out of one week  intermittent headaches, approximately 2-3 per week characterized below No chest pain, dyspnea, palpitations, leg edema  Considering starting to exercise  Headache   intermittent mild to moderate headaches in a bandlike distribution that last about one hour.  No photophobia, does have issues of bright light due to previous detached retina but this is baseline and not associated with headaches  no nausea,   relieved by Tylenol  Kidney disease  his mother was in dialysis that is very concerned, he does not use NSAIDs   Current life stressors  his mother's actively dying from brain cancer, he states that he's had more frequent headaches recently and he works about 50 hours a week as a [employer]. His wife states he is a stressful lifestyle and has a hard time coping with stress. She's recently bought him a puppy which seems to help.  Smoking status noted ROS: Per HPI  Objective: BP 142/81 mmHg  Pulse 63  Temp(Src) 98.4 F (36.9 C) (Oral)  Ht 6\' 2"  (1.88 m)  Wt 247 lb (112.038 kg)  BMI 31.70 kg/m2 Gen: NAD, alert, cooperative with exam HEENT: NCAT Ext: No edema, warm Neuro: Alert and oriented, No gross deficits  Assessment and plan:  Acute kidney injury Resolved, likely low level CKD Stage 2 CKD by cuurrent GFR 64   Tension headache Tension typ eheadaches, no red flags Stressful lifestyle, also mother actively dieing from brain cancer Discussed stress management strategies, possible mood d/o Discussed no need to eval for mood d/o currently given big life issues     Orders Placed This Encounter  Procedures  . Basic Metabolic Panel  . TSH    Meds ordered this encounter  Medications  . lisinopril-hydrochlorothiazide (PRINZIDE,ZESTORETIC) 10-12.5 MG per tablet    Sig: Take  1 tablet by mouth daily.    Dispense:  90 tablet    Refill:  3

## 2014-12-02 NOTE — Assessment & Plan Note (Signed)
Resolved, likely low level CKD Stage 2 CKD by cuurrent GFR 64

## 2014-12-02 NOTE — Assessment & Plan Note (Signed)
Tension typ eheadaches, no red flags Stressful lifestyle, also mother actively dieing from brain cancer Discussed stress management strategies, possible mood d/o Discussed no need to eval for mood d/o currently given big life issues

## 2014-12-02 NOTE — Patient Instructions (Signed)
Great to see you!  Lets see you back in 2-3 months  I will send a letter with your labs

## 2014-12-03 ENCOUNTER — Encounter: Payer: Self-pay | Admitting: Family Medicine

## 2014-12-03 LAB — BASIC METABOLIC PANEL
BUN: 15 mg/dL (ref 6–23)
CO2: 27 mEq/L (ref 19–32)
CREATININE: 1.27 mg/dL (ref 0.50–1.35)
Calcium: 8.7 mg/dL (ref 8.4–10.5)
Chloride: 104 mEq/L (ref 96–112)
GLUCOSE: 87 mg/dL (ref 70–99)
POTASSIUM: 3.9 meq/L (ref 3.5–5.3)
SODIUM: 140 meq/L (ref 135–145)

## 2016-10-04 DIAGNOSIS — H1031 Unspecified acute conjunctivitis, right eye: Secondary | ICD-10-CM | POA: Diagnosis not present

## 2016-10-13 ENCOUNTER — Telehealth: Payer: Self-pay | Admitting: Family Medicine

## 2016-10-13 NOTE — Telephone Encounter (Signed)
No answer. Left voicemail indicating that patient should call back concerning overdue HM/appointments. Patient has not been seen since 11/2014 and has history of hypertension.

## 2016-12-13 ENCOUNTER — Ambulatory Visit (INDEPENDENT_AMBULATORY_CARE_PROVIDER_SITE_OTHER): Payer: BLUE CROSS/BLUE SHIELD | Admitting: Student

## 2016-12-13 ENCOUNTER — Encounter: Payer: Self-pay | Admitting: Student

## 2016-12-13 DIAGNOSIS — I10 Essential (primary) hypertension: Secondary | ICD-10-CM

## 2016-12-13 DIAGNOSIS — L819 Disorder of pigmentation, unspecified: Secondary | ICD-10-CM

## 2016-12-13 MED ORDER — LISINOPRIL-HYDROCHLOROTHIAZIDE 10-12.5 MG PO TABS: 1.0000 | ORAL_TABLET | Freq: Every day | ORAL | 3 refills | Status: DC

## 2016-12-13 NOTE — Assessment & Plan Note (Signed)
Hyperpigmented lesions on arms of unclear origin. As patient feels they may be improving and are not causing symtpoms, will monitor for now - follow with PCP

## 2016-12-13 NOTE — Patient Instructions (Addendum)
Follow up with Dr Wende Mott in one week Take blood pressure medication as prescribed Call the office with questions or concerns

## 2016-12-13 NOTE — Progress Notes (Signed)
   Subjective:    Patient ID: [name], male    DOB: [birth year], 51 y.o.   MRN: [id number]   CC: skin changes  HPI: 51 y/o with HTN presents for skin changes  HTN - has not taken blood pressure medication for over one year - no headache, vision changes, SOB or chest pain  Skin changes - noted dark spots on his arms and legs about one month ago - he feels they may be getting better since he fist noted them - no pain, itching or bumps  Smoking status reviewed  Review of Systems  Per HPI, else denies recent illness, fever   Objective:  BP 138/84   Pulse 75   Temp 98.3 F (36.8 C) (Oral)   Wt 242 lb (109.8 kg)   SpO2 97%   BMI 31.07 kg/m  Vitals and nursing note reviewed  General: NAD Cardiac: RRR Respiratory: CTAB, normal effort Skin: irregular  Hyperpigmented lesions on bilateral arms and back, no rash, no erythema else warm and dry Neuro: alert and oriented, no focal deficits   Assessment & Plan:    ESSENTIAL HYPERTENSION, BENIGN BP stable - blood pressure medication refilled - BMP today - follow in 1 week  Hyperpigmentation Hyperpigmented lesions on arms of unclear origin. As patient feels they may be improving and are not causing symtpoms, will monitor for now - follow with PCP    Zyan Coby A. Kennon Rounds MD, MS Family Medicine Resident PGY-3 Pager [phone]

## 2016-12-13 NOTE — Assessment & Plan Note (Addendum)
BP stable - blood pressure medication refilled - BMP today - follow in 1 week

## 2016-12-14 LAB — BASIC METABOLIC PANEL
BUN/Creatinine Ratio: 13 (ref 9–20)
BUN: 17 mg/dL (ref 6–24)
CO2: 23 mmol/L (ref 18–29)
CREATININE: 1.28 mg/dL — AB (ref 0.76–1.27)
Calcium: 8.9 mg/dL (ref 8.7–10.2)
Chloride: 102 mmol/L (ref 96–106)
GFR, EST AFRICAN AMERICAN: 75 mL/min/{1.73_m2} (ref >59)
GFR, EST NON AFRICAN AMERICAN: 65 mL/min/{1.73_m2} (ref >59)
Glucose: 98 mg/dL (ref 65–99)
POTASSIUM: 4.4 mmol/L (ref 3.5–5.2)
Sodium: 140 mmol/L (ref 134–144)

## 2017-01-05 ENCOUNTER — Ambulatory Visit (INDEPENDENT_AMBULATORY_CARE_PROVIDER_SITE_OTHER): Payer: BLUE CROSS/BLUE SHIELD | Admitting: Family Medicine

## 2017-01-05 ENCOUNTER — Encounter: Payer: Self-pay | Admitting: Family Medicine

## 2017-01-05 VITALS — BP 156/100 | HR 75 | Temp 98.1°F | Ht 74.0 in | Wt 239.0 lb

## 2017-01-05 DIAGNOSIS — M533 Sacrococcygeal disorders, not elsewhere classified: Secondary | ICD-10-CM

## 2017-01-05 DIAGNOSIS — G8929 Other chronic pain: Secondary | ICD-10-CM | POA: Diagnosis not present

## 2017-01-05 DIAGNOSIS — R21 Rash and other nonspecific skin eruption: Secondary | ICD-10-CM

## 2017-01-05 DIAGNOSIS — Z205 Contact with and (suspected) exposure to viral hepatitis: Secondary | ICD-10-CM

## 2017-01-05 MED ORDER — NAPROXEN 500 MG PO TABS: ORAL_TABLET | ORAL | 5 refills | Status: DC

## 2017-01-05 NOTE — Patient Instructions (Signed)
It was a pleasure seeing you today in our clinic. Today we discussed your back pain and rash. This injury and the pain involved may take a long time to heal/resolve, so be patient. Here is the treatment plan I would like for you to follow:  For most musculoskeletal pain we physicians tend to recommend the "R.I.C.E." method of therapy. This stands for "Rest", "Ice", "Compression", and "Elevation". - Rest: I would like for you to take it easy for a period of time. This may be a period of days or weeks, everyone is different. Allow pain to be your guide. Achiness and soreness is to be expected, but true pain is to be avoided. - Ice: Using an ice pack 2-4 times a day for ~20 minutes each time can help with pain, swelling, and healing. - Compression: Using a compression sleeve or ACE Bandage (both available at most pharmacies) can help with swelling and pain. Having these on through the day is ideal, but do not sleep with these in place. - Elevation: It is not always possible to elevate an injury due to it's location on your body. However, whenever possible it would be beneficial to elevate musculoskeletal injuries above the level of the heart. (Example: propping an injured ankle up on pillows at night while asleep)  Naproxen and Tylenol: For most musculoskeletal pain I tell patients to alternate taking naproxen and Tylenol. Begin taking the naproxen 2 times a day (scheduled), then take the Tylenol inbetween each of the tylenol dosings as needed for additional pain (this allows for Tylenol to be taken up to 3 times a day).  Dosing: - 500mg  of naproxen 2 times a day. - 650-1000mg  of Tylenol 3 times a day (max dose).
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
## 2017-01-05 NOTE — Assessment & Plan Note (Signed)
Patient is presenting with a new flare occurring in his SI joints bilaterally. No red flag symptoms at this time. - Naproxen twice a day 5-7 days. Then twice a day when necessary after that. - Encouraged exercise and core strengthening.  Next: I discussed with patient that if pain persists or worsens would strongly consider recommendation for physical therapy at that time.

## 2017-01-05 NOTE — Assessment & Plan Note (Signed)
Patient is here with a two-month history of hyperpigmented plaques over the flexor and extensor surfaces of his upper extremities bilaterally. No significant worsening since initial presentation. Etiology unknown at this time however rash appears relatively benign at this time. Differential includes ecchymosis, secondary syphilis, lichen planus, idiopathic. - Reassurance and patience encouraged. - Obtaining RPR to rule out syphilis - Obtaining CMP to assess liver and kidney function - Encouraged patient to be aware of any changes/developments in these plaques over the next 2 months. - OTC hydrocortisone cream if rash begins to itch. - Patient to follow-up in 2 months if plaques persist/worsen  Next: Assuming labs obtained today are unremarkable, patient was asked to follow-up in 2 months if rash persists. If this occurs would strongly consider punch biopsy at that time.

## 2017-01-06 LAB — CBC
HEMATOCRIT: 37.5 % (ref 37.5–51.0)
HEMOGLOBIN: 12.1 g/dL — AB (ref 13.0–17.7)
MCH: 26.9 pg (ref 26.6–33.0)
MCHC: 32.3 g/dL (ref 31.5–35.7)
MCV: 83 fL (ref 79–97)
Platelets: 287 10*3/uL (ref 150–379)
RBC: 4.5 x10E6/uL (ref 4.14–5.80)
RDW: 13.9 % (ref 12.3–15.4)
WBC: 5.1 10*3/uL (ref 3.4–10.8)

## 2017-01-06 LAB — RPR: RPR Ser Ql: NONREACTIVE

## 2017-01-06 LAB — COMPREHENSIVE METABOLIC PANEL
ALBUMIN: 4.3 g/dL (ref 3.5–5.5)
ALT: 17 IU/L (ref 0–44)
AST: 19 IU/L (ref 0–40)
Albumin/Globulin Ratio: 1.4 (ref 1.2–2.2)
Alkaline Phosphatase: 85 IU/L (ref 39–117)
BUN / CREAT RATIO: 12 (ref 9–20)
BUN: 22 mg/dL (ref 6–24)
Bilirubin Total: 0.8 mg/dL (ref 0.0–1.2)
CO2: 21 mmol/L (ref 18–29)
CREATININE: 1.84 mg/dL — AB (ref 0.76–1.27)
Calcium: 8.9 mg/dL (ref 8.7–10.2)
Chloride: 105 mmol/L (ref 96–106)
GFR calc non Af Amer: 42 mL/min/{1.73_m2} — ABNORMAL LOW (ref >59)
GFR, EST AFRICAN AMERICAN: 49 mL/min/{1.73_m2} — AB (ref >59)
Globulin, Total: 3 g/dL (ref 1.5–4.5)
Glucose: 102 mg/dL — ABNORMAL HIGH (ref 65–99)
Potassium: 4.6 mmol/L (ref 3.5–5.2)
Sodium: 141 mmol/L (ref 134–144)
TOTAL PROTEIN: 7.3 g/dL (ref 6.0–8.5)

## 2017-01-06 LAB — HIV ANTIBODY (ROUTINE TESTING W REFLEX): HIV Screen 4th Generation wRfx: NONREACTIVE

## 2018-01-11 ENCOUNTER — Emergency Department (HOSPITAL_COMMUNITY): Payer: BLUE CROSS/BLUE SHIELD

## 2018-01-11 ENCOUNTER — Encounter (HOSPITAL_COMMUNITY): Payer: Self-pay

## 2018-01-11 ENCOUNTER — Emergency Department (HOSPITAL_COMMUNITY)
Admission: EM | Admit: 2018-01-11 | Discharge: 2018-01-11 | Disposition: A | Discharge status: Home / Self Care | Payer: BLUE CROSS/BLUE SHIELD | Attending: Emergency Medicine | Admitting: Emergency Medicine

## 2018-01-11 DIAGNOSIS — N341 Nonspecific urethritis: Secondary | ICD-10-CM | POA: Insufficient documentation

## 2018-01-11 DIAGNOSIS — N451 Epididymitis: Secondary | ICD-10-CM | POA: Diagnosis not present

## 2018-01-11 DIAGNOSIS — N189 Chronic kidney disease, unspecified: Secondary | ICD-10-CM | POA: Insufficient documentation

## 2018-01-11 DIAGNOSIS — N342 Other urethritis: Secondary | ICD-10-CM

## 2018-01-11 DIAGNOSIS — N433 Hydrocele, unspecified: Secondary | ICD-10-CM | POA: Diagnosis not present

## 2018-01-11 DIAGNOSIS — I129 Hypertensive chronic kidney disease with stage 1 through stage 4 chronic kidney disease, or unspecified chronic kidney disease: Secondary | ICD-10-CM | POA: Diagnosis not present

## 2018-01-11 DIAGNOSIS — N50812 Left testicular pain: Secondary | ICD-10-CM | POA: Diagnosis not present

## 2018-01-11 HISTORY — DX: Essential (primary) hypertension: I10

## 2018-01-11 LAB — URINALYSIS, ROUTINE W REFLEX MICROSCOPIC
BILIRUBIN URINE: NEGATIVE
Bacteria, UA: NONE SEEN
Glucose, UA: NEGATIVE mg/dL
KETONES UR: NEGATIVE mg/dL
Nitrite: NEGATIVE
PH: 5 (ref 5.0–8.0)
Protein, ur: 100 mg/dL — AB
Specific Gravity, Urine: 1.025 (ref 1.005–1.030)
WBC, UA: >50 WBC/hpf — ABNORMAL HIGH (ref 0–5)

## 2018-01-11 MED ORDER — IBUPROFEN 400 MG PO TABS
600.0000 mg | ORAL_TABLET | Freq: Once | ORAL | Status: AC
  Administered 2018-01-11: 600 mg via ORAL
  Filled 2018-01-11: qty 1

## 2018-01-11 MED ORDER — DOXYCYCLINE HYCLATE 100 MG PO TABS
100.0000 mg | ORAL_TABLET | Freq: Once | ORAL | Status: AC
  Administered 2018-01-11: 100 mg via ORAL
  Filled 2018-01-11: qty 1

## 2018-01-11 MED ORDER — CEFTRIAXONE SODIUM 250 MG IJ SOLR
250.0000 mg | Freq: Once | INTRAMUSCULAR | Status: AC
  Administered 2018-01-11: 250 mg via INTRAMUSCULAR
  Filled 2018-01-11: qty 250

## 2018-01-11 MED ORDER — DOXYCYCLINE HYCLATE 100 MG PO CAPS: 100.0000 mg | ORAL_CAPSULE | Freq: Two times a day (BID) | ORAL | 0 refills | Status: DC

## 2018-01-11 MED ORDER — LIDOCAINE HCL (PF) 1 % IJ SOLN
INTRAMUSCULAR | Status: AC
  Filled 2018-01-11: qty 5

## 2018-01-11 NOTE — ED Triage Notes (Signed)
Pt reports left testicle pain and swelling. He reports painful to touch with urination. He endorses yellow discharge from penis

## 2018-01-11 NOTE — ED Provider Notes (Signed)
[facility] EMERGENCY DEPARTMENT Provider Note   CSN: [id number] Arrival date & time: 01/11/18  1004     History   Chief Complaint Chief Complaint  Patient presents with  . Testicle Pain    HPI Erik Minturn. is a 52 y.o. male.  HPI  52 year old male presents with left testicle pain.  Started 3 days ago.  No trauma.  Pain is about an 8/10.  He has noticed left testicle swelling.  He has had [place] urinary discharge during this time and some abnormal feeling when he urinates.  No fevers or abdominal pain.  He denies concern for STI. Has not taken anything for the pain.  Past Medical History:  Diagnosis Date  . Hypertension     Patient Active Problem List   Diagnosis Date Noted  . Rash and nonspecific skin eruption 01/05/2017  . Chronic SI joint pain 01/05/2017  . Hyperpigmentation 12/13/2016  . Tension headache 12/02/2014  . Healthcare maintenance 10/21/2014  . Chronic kidney disease 11/28/2012  . Acute kidney injury (HCC) 05/09/2012  . Erectile dysfunction 05/09/2012  . ESSENTIAL HYPERTENSION, BENIGN 01/27/2009    Past Surgical History:  Procedure Laterality Date  . ENDOVENOUS ABLATION SAPHENOUS VEIN W/ LASER  01/08/13   left leg for chronic superficial venous indufficiency, by Dr. Ardyth Gal         Home Medications    Prior to Admission medications   Medication Sig Start Date End Date Taking? Authorizing Provider  doxycycline (VIBRAMYCIN) 100 MG capsule Take 1 capsule (100 mg total) by mouth 2 (two) times daily. One po bid x 7 days 01/12/18   Pricilla Loveless, MD  lisinopril-hydrochlorothiazide (PRINZIDE,ZESTORETIC) 10-12.5 MG tablet Take 1 tablet by mouth daily. Patient not taking: Reported on 01/11/2018 12/13/16   Velora Heckler A, MD  naproxen (NAPROSYN) 500 MG tablet Take 1 tablet twice a day for 7 days. Then take 1 tablet twice a day AS NEEDED after that. Patient not taking: Reported on 01/11/2018 01/05/17   McKeag, Janine Ores, MD  sildenafil  (VIAGRA) 25 MG tablet Take 1 tablet (25 mg total) by mouth daily as needed for erectile dysfunction. If needed, can take 2 at a time if 1 does not work 05/09/12 06/08/12  McGill, Marga Hoots, MD    Family History No family history on file.  Social History Social History   Tobacco Use  . Smoking status: Never Smoker  . Smokeless tobacco: Never Used  Substance Use Topics  . Alcohol use: Not on file  . Drug use: Not on file     Allergies   Patient has no known allergies.   Review of Systems Review of Systems  Constitutional: Negative for fever.  Gastrointestinal: Negative for abdominal pain and vomiting.  Genitourinary: Positive for discharge, dysuria, scrotal swelling and testicular pain. Negative for penile pain and penile swelling.  All other systems reviewed and are negative.    Physical Exam Updated Vital Signs BP (!) 145/87   Pulse 69   Temp 97.8 F (36.6 C) (Oral)   Resp 16   SpO2 99%   Physical Exam  Constitutional: He is oriented to person, place, and time. He appears well-developed and well-nourished.  HENT:  Head: Normocephalic and atraumatic.  Right Ear: External ear normal.  Left Ear: External ear normal.  Nose: Nose normal.  Eyes: Right eye exhibits no discharge. Left eye exhibits no discharge.  Pulmonary/Chest: Effort normal.  Abdominal: Soft. He exhibits no distension. There is no tenderness.  Genitourinary: Left  testis shows swelling and tenderness. No penile erythema or penile tenderness. No discharge found.  Neurological: He is alert and oriented to person, place, and time.  Skin: Skin is warm and dry.  Nursing note and vitals reviewed.    ED Treatments / Results  Labs (all labs ordered are listed, but only abnormal results are displayed) Labs Reviewed  URINALYSIS, ROUTINE W REFLEX MICROSCOPIC - Abnormal; Notable for the following components:      Result Value   APPearance HAZY (*)    Hgb urine dipstick MODERATE (*)    Protein, ur 100  (*)    Leukocytes, UA LARGE (*)    WBC, UA >50 (*)    All other components within normal limits  RPR  HIV ANTIBODY (ROUTINE TESTING)  GC/CHLAMYDIA PROBE AMP ([place]) NOT AT [facility]    EKG None  Radiology US Scrotum W/doppler  Result Date: 01/11/2018 CLINICAL DATA:  LEFT testicular pain for 3 days.  53 year old male EXAM: SCROTAL ULTRASOUND DOPPLER ULTRASOUND OF THE TESTICLES TECHNIQUE: Complete ultrasound examination of the testicles, epididymis, and other scrotal structures was performed. Color and spectral Doppler ultrasound were also utilized to evaluate blood flow to the testicles. COMPARISON:  None. FINDINGS: Right testicle Measurements: Normal size and homogeneous echotexture measuring 3.4 x 2.5 x 2.5 cm. No mass or microlithiasis visualized. Left testicle Measurements: Normal size and homogeneous echotexture measuring 3.7 x 2.1 x 3.1 cm. No mass or microlithiasis visualized. Right epididymis: Normal size and vascularity. Small epididymal cyst measuring 9 mm. Left epididymis: The tail of the LEFT epididymis in the inferior aspect of the LEFT hemiscrotum is enlarged and hyperemic. The tail measures 3.7 x 1.8 x 2.7 cm. Patient is tender in this area.The head of the epididymis on the LEFT appears normal. Hydrocele: There is fluid within LEFT hemiscrotum with mild increased echogenicity. No septations. Varicocele:  None visualized. Pulsed Doppler interrogation of both testes demonstrates normal low resistance arterial and venous waveforms bilaterally. IMPRESSION: 1. LEFT epididymitis involving the tail of the LEFT epididymis. No evidence of orchitis. 2. Mildly complex LEFT hydrocele.  No abscess formation. 3. Normal testicles with normal color Doppler flow. Electronically Signed   By: Genevive Bi M.D.   On: 01/11/2018 21:19    Procedures Procedures (including critical care time)  Medications Ordered in ED Medications  lidocaine (PF) (XYLOCAINE) 1 % injection (has no administration in  time range)  cefTRIAXone (ROCEPHIN) injection 250 mg (250 mg Intramuscular Given 01/11/18 2025)  doxycycline (VIBRA-TABS) tablet 100 mg (100 mg Oral Given 01/11/18 2025)  ibuprofen (ADVIL,MOTRIN) tablet 600 mg (600 mg Oral Given 01/11/18 2025)     Initial Impression / Assessment and Plan / ED Course  I have reviewed the triage vital signs and the nursing notes.  Pertinent labs & imaging results that were available during my care of the patient were reviewed by me and considered in my medical decision making (see chart for details).     Patient denies significant risk factors for STI but with the urethral discharge and epididymitis on exam and ultrasound will treat as possible STI.  He was given Rocephin and will be given doxycycline.  Advised NSAIDs and scrotal support.  There is no obvious abscess.  He is not systemically ill.  We have discussed holding off on intercourse until further cleared and to follow-up with his PCP.  Labs such as HIV and RPR will be evaluated for other STI.  I think the leukocytes in his urine are related to the urethritis  rather than UTI given no abdominal symptoms.  Discharge home with return precautions.  Final Clinical Impressions(s) / ED Diagnoses   Final diagnoses:  Epididymitis  Urethritis    ED Discharge Orders        Ordered    doxycycline (VIBRAMYCIN) 100 MG capsule  2 times daily     01/11/18 2137       Pricilla Loveless, MD 01/11/18 2154

## 2018-01-11 NOTE — Discharge Instructions (Addendum)
Take ibuprofen for your testicular/scrotum pain.  If you develop worsening pain, swelling, or redness or you develop other symptoms such as fever, abdominal pain, vomiting, or other new/concerning symptoms then return to the ER for evaluation.  Otherwise follow-up with your family doctor in about 1 week.  Do not have intercourse until your symptoms have completely cleared and you have finished the antibiotics.

## 2018-01-14 LAB — GC/CHLAMYDIA PROBE AMP (~~LOC~~) NOT AT ARMC
CHLAMYDIA, DNA PROBE: NEGATIVE
Neisseria Gonorrhea: NEGATIVE

## 2018-03-01 DIAGNOSIS — M25551 Pain in right hip: Secondary | ICD-10-CM | POA: Diagnosis not present

## 2018-03-01 DIAGNOSIS — M1611 Unilateral primary osteoarthritis, right hip: Secondary | ICD-10-CM | POA: Diagnosis not present

## 2018-03-27 DIAGNOSIS — M25551 Pain in right hip: Secondary | ICD-10-CM | POA: Diagnosis not present

## 2018-07-31 DIAGNOSIS — Z202 Contact with and (suspected) exposure to infections with a predominantly sexual mode of transmission: Secondary | ICD-10-CM | POA: Diagnosis not present

## 2018-07-31 DIAGNOSIS — N50819 Testicular pain, unspecified: Secondary | ICD-10-CM | POA: Diagnosis not present

## 2018-07-31 DIAGNOSIS — R369 Urethral discharge, unspecified: Secondary | ICD-10-CM | POA: Diagnosis not present

## 2018-08-09 DIAGNOSIS — J111 Influenza due to unidentified influenza virus with other respiratory manifestations: Secondary | ICD-10-CM | POA: Diagnosis not present

## 2019-01-15 DIAGNOSIS — R102 Pelvic and perineal pain: Secondary | ICD-10-CM | POA: Diagnosis not present

## 2019-01-15 DIAGNOSIS — M549 Dorsalgia, unspecified: Secondary | ICD-10-CM | POA: Diagnosis not present

## 2019-01-15 DIAGNOSIS — M25551 Pain in right hip: Secondary | ICD-10-CM | POA: Diagnosis not present

## 2019-01-15 DIAGNOSIS — Z6831 Body mass index (BMI) 31.0-31.9, adult: Secondary | ICD-10-CM | POA: Diagnosis not present

## 2019-01-15 DIAGNOSIS — N189 Chronic kidney disease, unspecified: Secondary | ICD-10-CM | POA: Diagnosis not present

## 2019-01-15 DIAGNOSIS — M533 Sacrococcygeal disorders, not elsewhere classified: Secondary | ICD-10-CM | POA: Diagnosis not present

## 2019-01-15 DIAGNOSIS — M1611 Unilateral primary osteoarthritis, right hip: Secondary | ICD-10-CM | POA: Diagnosis not present

## 2019-01-15 DIAGNOSIS — I129 Hypertensive chronic kidney disease with stage 1 through stage 4 chronic kidney disease, or unspecified chronic kidney disease: Secondary | ICD-10-CM | POA: Diagnosis not present

## 2019-01-29 DIAGNOSIS — M1611 Unilateral primary osteoarthritis, right hip: Secondary | ICD-10-CM | POA: Diagnosis not present

## 2019-03-04 DIAGNOSIS — M1611 Unilateral primary osteoarthritis, right hip: Secondary | ICD-10-CM | POA: Diagnosis not present

## 2019-03-04 DIAGNOSIS — M87051 Idiopathic aseptic necrosis of right femur: Secondary | ICD-10-CM | POA: Diagnosis not present

## 2019-03-07 IMAGING — US US SCROTUM W/ DOPPLER COMPLETE
1 series · 13 of 25 positions shown · non-contrast
Comparison: None.

CLINICAL DATA: LEFT testicular pain for 3 days.  52-year-old male

EXAM:
SCROTAL ULTRASOUND
DOPPLER ULTRASOUND OF THE TESTICLES
TECHNIQUE: Complete ultrasound examination of the testicles, epididymis, and
other scrotal structures was performed. Color and spectral Doppler
ultrasound were also utilized to evaluate blood flow to the
testicles.

[Series 1: us scrotum w/ doppler complete · 0.06mm/px · 13 of 84 slices shown]
[im 1/84]
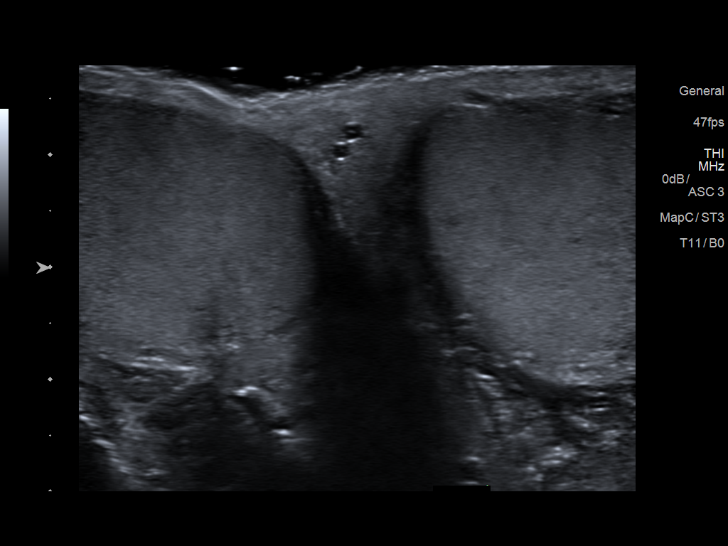
[im 7/84]
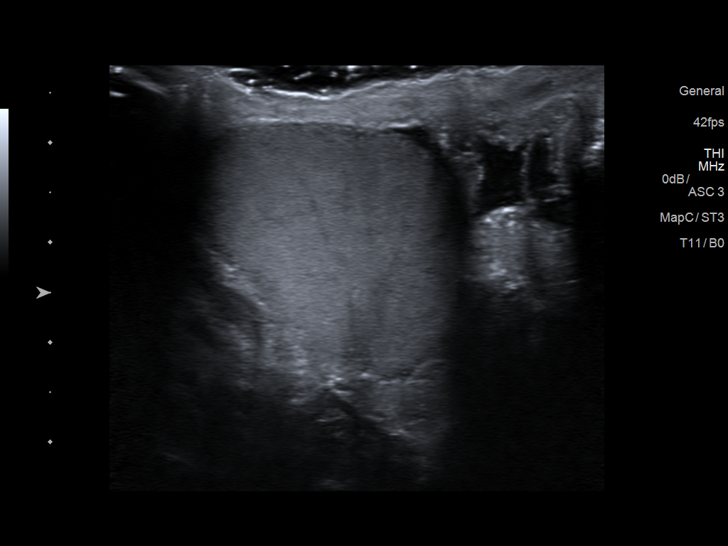
[im 14/84]
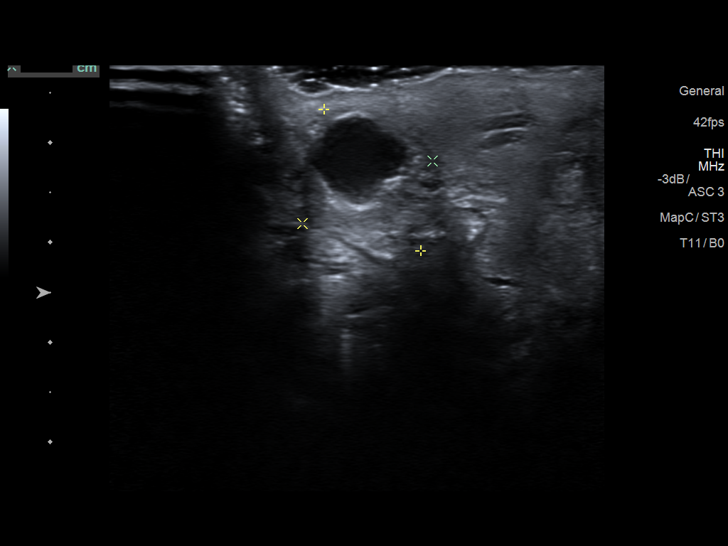
[im 21/84]
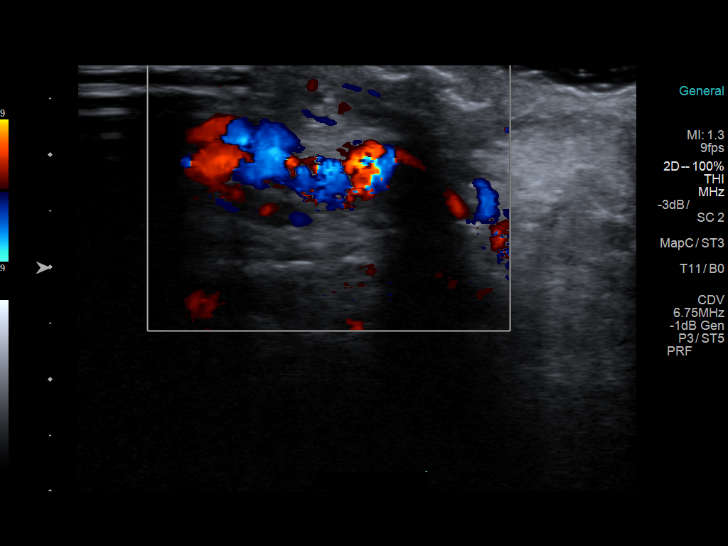
[im 28/84]
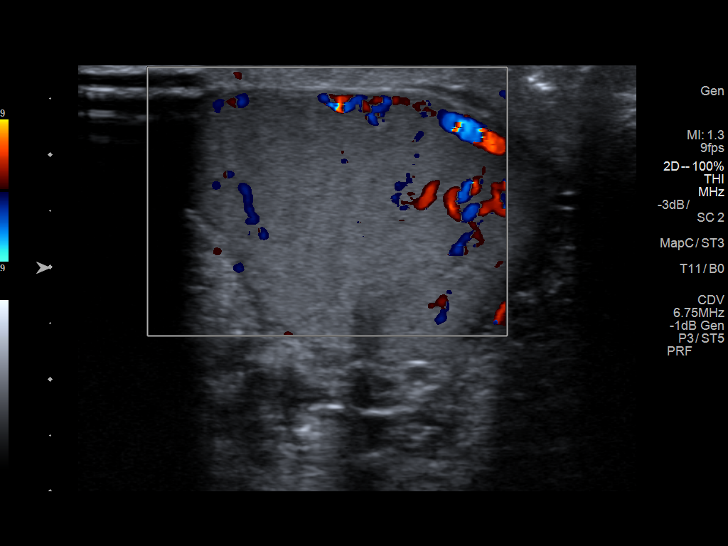
[im 35/84]
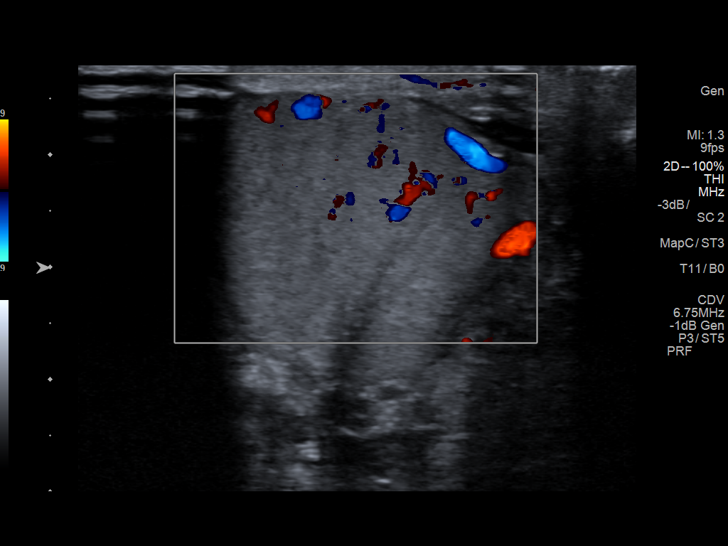
[im 42/84]
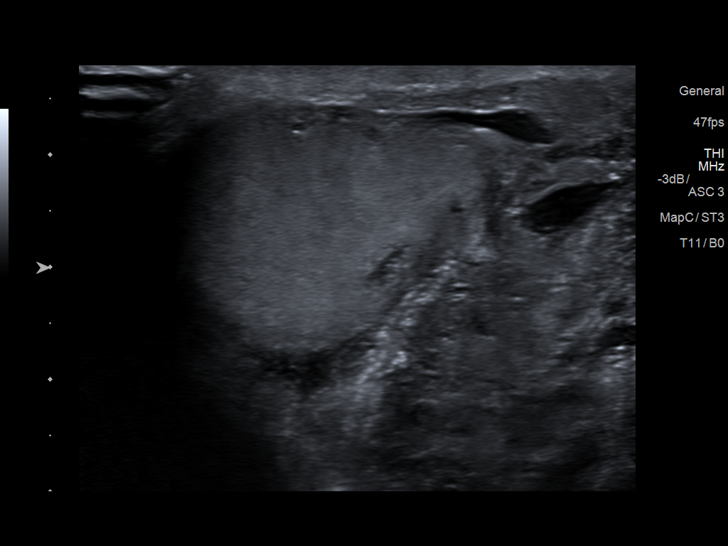
[im 49/84]
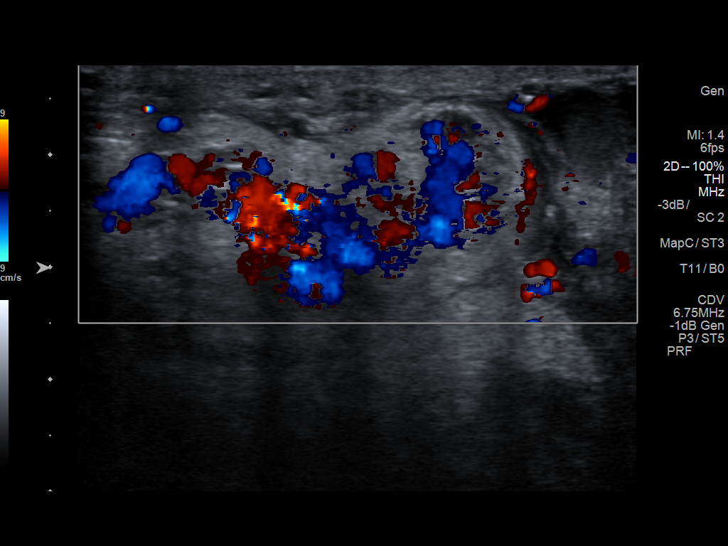
[im 56/84]
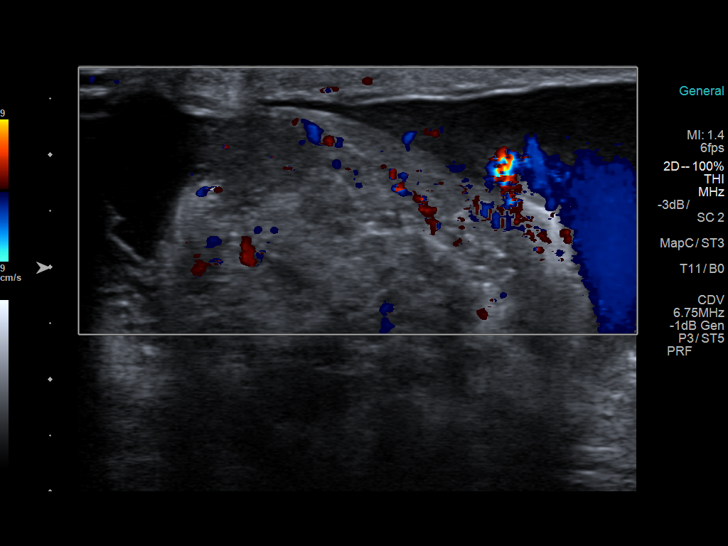
[im 63/84]
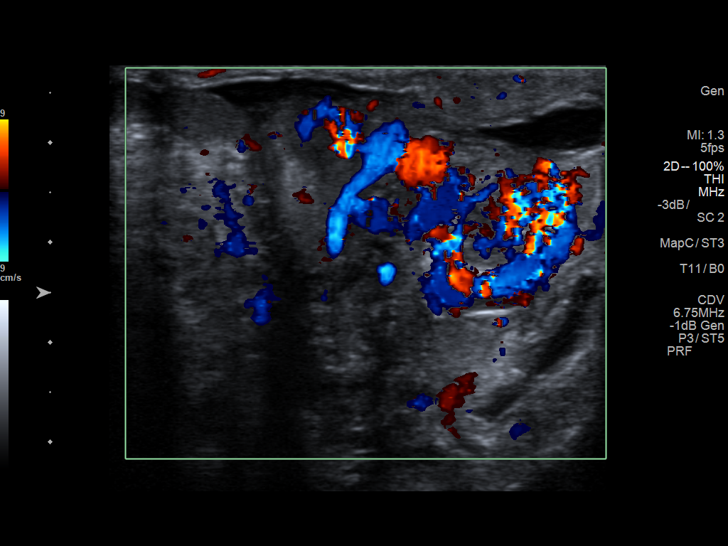
[im 70/84]
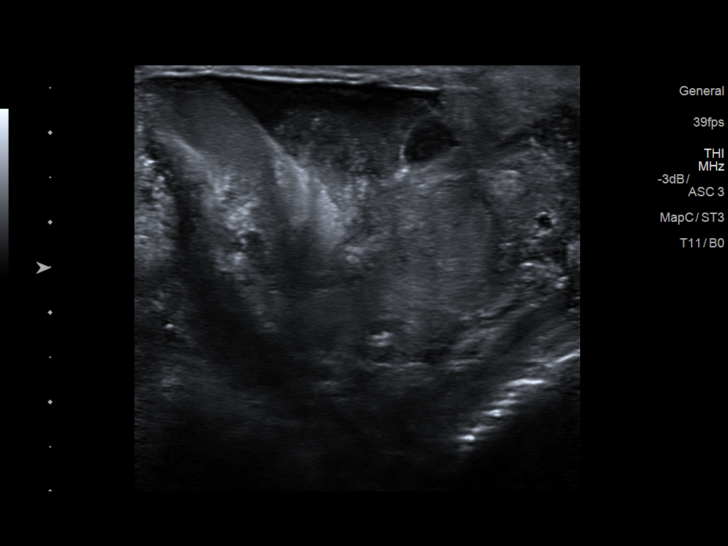
[im 77/84]
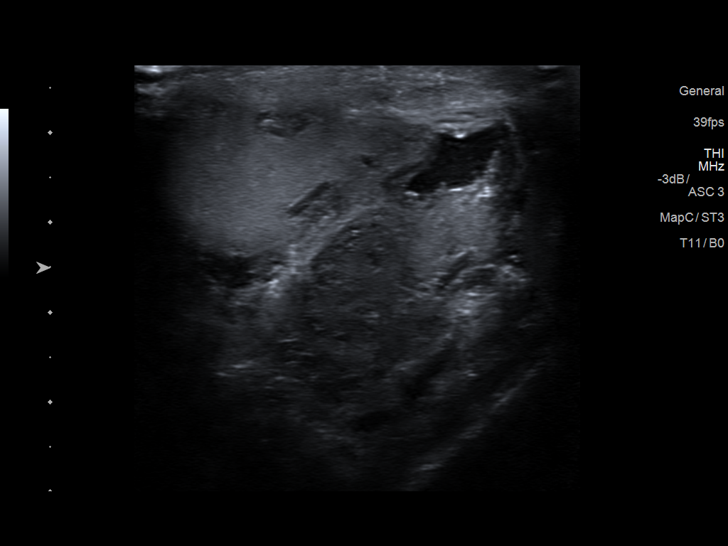
[im 84/84]
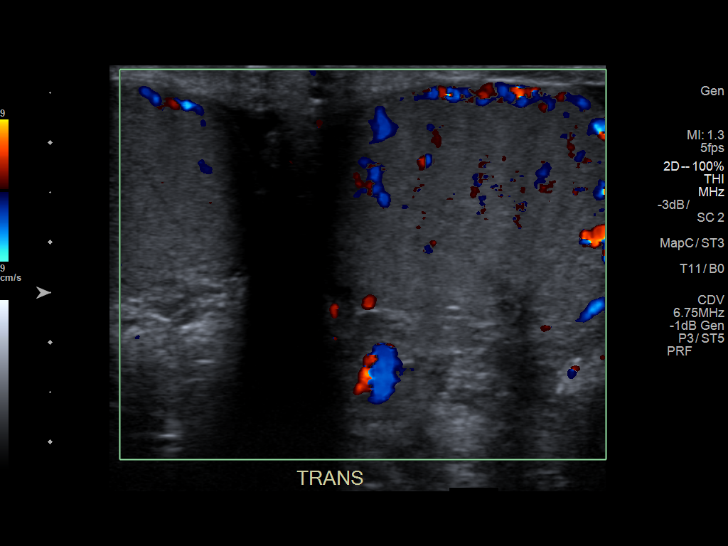

[13 of 25 positions shown; findings below may reference images not displayed]

FINDINGS: Right testicle

Measurements: Normal size and homogeneous echotexture measuring
x 2.5 x 2.5 cm. No mass or microlithiasis visualized.

Left testicle

Measurements: Normal size and homogeneous echotexture measuring
x 2.1 x 3.1 cm. No mass or microlithiasis visualized.

Right epididymis: Normal size and vascularity. Small epididymal cyst
measuring 9 mm.

Left epididymis: The tail of the LEFT epididymis in the inferior
aspect of the LEFT hemiscrotum is enlarged and hyperemic. The tail
measures 3.7 x 1.8 x 2.7 cm. Patient is tender in this area.The head
of the epididymis on the LEFT appears normal.

Hydrocele: There is fluid within LEFT hemiscrotum with mild
increased echogenicity. No septations.

Varicocele:  None visualized.

Pulsed Doppler interrogation of both testes demonstrates normal low
resistance arterial and venous waveforms bilaterally.
IMPRESSION: 1. LEFT epididymitis involving the tail of the LEFT epididymis. No
evidence of orchitis.
2. Mildly complex LEFT hydrocele.  No abscess formation.
3. Normal testicles with normal color Doppler flow.

## 2019-06-03 ENCOUNTER — Ambulatory Visit (INDEPENDENT_AMBULATORY_CARE_PROVIDER_SITE_OTHER): Payer: BC Managed Care – PPO | Admitting: Family Medicine

## 2019-06-03 ENCOUNTER — Other Ambulatory Visit: Payer: Self-pay

## 2019-06-03 ENCOUNTER — Encounter: Payer: Self-pay | Admitting: Family Medicine

## 2019-06-03 VITALS — BP 158/100 | HR 58 | Wt 247.0 lb

## 2019-06-03 DIAGNOSIS — Z23 Encounter for immunization: Secondary | ICD-10-CM | POA: Diagnosis not present

## 2019-06-03 DIAGNOSIS — M1990 Unspecified osteoarthritis, unspecified site: Secondary | ICD-10-CM | POA: Insufficient documentation

## 2019-06-03 DIAGNOSIS — M47817 Spondylosis without myelopathy or radiculopathy, lumbosacral region: Secondary | ICD-10-CM

## 2019-06-03 DIAGNOSIS — Z0000 Encounter for general adult medical examination without abnormal findings: Secondary | ICD-10-CM | POA: Diagnosis not present

## 2019-06-03 DIAGNOSIS — N189 Chronic kidney disease, unspecified: Secondary | ICD-10-CM

## 2019-06-03 DIAGNOSIS — I10 Essential (primary) hypertension: Secondary | ICD-10-CM

## 2019-06-03 DIAGNOSIS — Z125 Encounter for screening for malignant neoplasm of prostate: Secondary | ICD-10-CM | POA: Diagnosis not present

## 2019-06-03 DIAGNOSIS — H5452A1 Low vision left eye category 1, normal vision right eye: Secondary | ICD-10-CM

## 2019-06-03 MED ORDER — LOSARTAN POTASSIUM-HCTZ 50-12.5 MG PO TABS: 1.0000 | ORAL_TABLET | Freq: Every day | ORAL | 3 refills | Status: DC

## 2019-06-03 NOTE — Assessment & Plan Note (Signed)
Check labs today.   May need follow up UA

## 2019-06-03 NOTE — Progress Notes (Signed)
Subjective  [name]. is a 53 y.o. male is presenting for a physical  Feels well except DJD pain all over but worse in R hip - he is scheduled for replacement in a few months  Hypertension - has not taken medications for years.  Not sure why stopped.  No chest pain or shortness of breath or lightheadness   CKD - recalls that he had this dx but not sure why.  No edema  Patient reports no  vision/ hearing changes,anorexia, weight change, fever ,adenopathy, persistant / recurrent hoarseness, swallowing issues, chest pain, edema,persistant / recurrent cough, hemoptysis, dyspnea(rest, exertional, paroxysmal nocturnal), gastrointestinal  bleeding (melena, rectal bleeding), abdominal pain, excessive heart burn, GU symptoms(dysuria, hematuria, pyuria, voiding/incontinence  Issues) syncope, focal weakness, severe memory loss, concerning skin lesions, depression, anxiety, abnormal bruising/bleeding, major joint swelling.    Uses cocaine intermittently.  Plans to stop due to his hypertension   Chief Complaint noted Review of Symptoms - see HPI PMH - Smoking status noted.    Objective Vital Signs reviewed BP (!) 158/100   Pulse (!) 58   Wt 247 lb (112 kg)   SpO2 92%   BMI 31.71 kg/m  Neck:  No deformities, thyromegaly, masses, or tenderness noted.   Supple with full range of motion without pain. Heart - Regular rate and rhythm.  No murmurs, gallops or rubs.    Lungs:  Normal respiratory effort, chest expands symmetrically. Lungs are clear to auscultation, no crackles or wheezes. Abdomen: soft and non-tender without masses, organomegaly or hernias noted.  No guarding or rebound Extremities:  No cyanosis, edema, or deformity noted with good range of motion of all major joints.  Has pain in L hip  Mobility:able to get up and down from exam table without assistance or distress Skin:  Intact without suspicious lesions or rashes Psych:  Cognition and judgment appear intact. Alert,  communicative  and cooperative with normal attention span and concentration. No apparent delusions, illusions, hallucinations  Assessments/Plans  Chronic kidney disease Check labs today.   May need follow up UA   ESSENTIAL HYPERTENSION, BENIGN Not at goal.  Start Losartan HCTZ and check labs   Low vision left eye category 1, normal vision right eye Detached retina on L from [place] maintenance recommend stop cocaine, keep his weight at current level   See after visit summary for details of patient instructions

## 2019-06-03 NOTE — Assessment & Plan Note (Signed)
recommend stop cocaine, keep his weight at current level

## 2019-06-03 NOTE — Assessment & Plan Note (Signed)
Not at goal.  Start Losartan HCTZ and check labs

## 2019-06-03 NOTE — Patient Instructions (Signed)
Good to see you today!  Thanks for coming in.  I will call you if your lab tests are not normal.  Otherwise we will discuss them at your next visit.  Come back in 3-4 weeks to check your blood pressure  If you can check it at home or at a store then write down the readings  Call me if you have any problems particularly any swelling or chest pain
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
## 2019-06-04 LAB — CMP14+EGFR
ALT: 16 IU/L (ref 0–44)
AST: 15 IU/L (ref 0–40)
Albumin/Globulin Ratio: 1.6 (ref 1.2–2.2)
Albumin: 4.4 g/dL (ref 3.8–4.9)
Alkaline Phosphatase: 122 IU/L — ABNORMAL HIGH (ref 39–117)
BUN/Creatinine Ratio: 9 (ref 9–20)
BUN: 13 mg/dL (ref 6–24)
Bilirubin Total: 0.9 mg/dL (ref 0.0–1.2)
CO2: 23 mmol/L (ref 20–29)
Calcium: 9.5 mg/dL (ref 8.7–10.2)
Chloride: 104 mmol/L (ref 96–106)
Creatinine, Ser: 1.43 mg/dL — ABNORMAL HIGH (ref 0.76–1.27)
GFR calc Af Amer: 65 mL/min/{1.73_m2} (ref >59)
GFR calc non Af Amer: 56 mL/min/{1.73_m2} — ABNORMAL LOW (ref >59)
Globulin, Total: 2.8 g/dL (ref 1.5–4.5)
Glucose: 90 mg/dL (ref 65–99)
Potassium: 4.1 mmol/L (ref 3.5–5.2)
Sodium: 141 mmol/L (ref 134–144)
Total Protein: 7.2 g/dL (ref 6.0–8.5)

## 2019-06-04 LAB — CBC
Hematocrit: 37.3 % — ABNORMAL LOW (ref 37.5–51.0)
Hemoglobin: 12.6 g/dL — ABNORMAL LOW (ref 13.0–17.7)
MCH: 27 pg (ref 26.6–33.0)
MCHC: 33.8 g/dL (ref 31.5–35.7)
MCV: 80 fL (ref 79–97)
Platelets: 281 10*3/uL (ref 150–450)
RBC: 4.66 x10E6/uL (ref 4.14–5.80)
RDW: 12.8 % (ref 11.6–15.4)
WBC: 5 10*3/uL (ref 3.4–10.8)

## 2019-06-04 LAB — PSA: Prostate Specific Ag, Serum: 1.4 ng/mL (ref 0.0–4.0)

## 2019-06-21 DIAGNOSIS — R309 Painful micturition, unspecified: Secondary | ICD-10-CM | POA: Diagnosis not present

## 2019-06-21 DIAGNOSIS — Z125 Encounter for screening for malignant neoplasm of prostate: Secondary | ICD-10-CM | POA: Diagnosis not present

## 2019-06-21 DIAGNOSIS — I10 Essential (primary) hypertension: Secondary | ICD-10-CM | POA: Diagnosis not present

## 2019-06-21 DIAGNOSIS — Z113 Encounter for screening for infections with a predominantly sexual mode of transmission: Secondary | ICD-10-CM | POA: Diagnosis not present

## 2019-07-02 ENCOUNTER — Ambulatory Visit: Payer: BC Managed Care – PPO | Admitting: Family Medicine

## 2019-07-28 DIAGNOSIS — Z471 Aftercare following joint replacement surgery: Secondary | ICD-10-CM | POA: Diagnosis not present

## 2019-07-28 DIAGNOSIS — Z01818 Encounter for other preprocedural examination: Secondary | ICD-10-CM | POA: Diagnosis not present

## 2019-07-28 DIAGNOSIS — I10 Essential (primary) hypertension: Secondary | ICD-10-CM | POA: Diagnosis not present

## 2019-07-28 DIAGNOSIS — M1611 Unilateral primary osteoarthritis, right hip: Secondary | ICD-10-CM | POA: Diagnosis not present

## 2019-07-28 DIAGNOSIS — M25551 Pain in right hip: Secondary | ICD-10-CM | POA: Diagnosis not present

## 2019-07-28 DIAGNOSIS — Z86718 Personal history of other venous thrombosis and embolism: Secondary | ICD-10-CM | POA: Diagnosis not present

## 2019-07-28 DIAGNOSIS — Z96641 Presence of right artificial hip joint: Secondary | ICD-10-CM | POA: Diagnosis not present

## 2019-07-29 DIAGNOSIS — Z01812 Encounter for preprocedural laboratory examination: Secondary | ICD-10-CM | POA: Diagnosis not present

## 2019-07-29 DIAGNOSIS — Z87898 Personal history of other specified conditions: Secondary | ICD-10-CM | POA: Diagnosis not present

## 2019-08-29 HISTORY — PX: TOTAL HIP ARTHROPLASTY: SHX124

## 2021-03-15 DIAGNOSIS — I10 Essential (primary) hypertension: Secondary | ICD-10-CM | POA: Diagnosis not present

## 2021-03-15 DIAGNOSIS — D649 Anemia, unspecified: Secondary | ICD-10-CM | POA: Diagnosis not present

## 2021-03-15 DIAGNOSIS — M10031 Idiopathic gout, right wrist: Secondary | ICD-10-CM | POA: Diagnosis not present

## 2021-03-30 DIAGNOSIS — D649 Anemia, unspecified: Secondary | ICD-10-CM | POA: Diagnosis not present

## 2021-03-30 DIAGNOSIS — R7989 Other specified abnormal findings of blood chemistry: Secondary | ICD-10-CM | POA: Diagnosis not present

## 2021-03-30 DIAGNOSIS — I10 Essential (primary) hypertension: Secondary | ICD-10-CM | POA: Diagnosis not present

## 2021-03-30 DIAGNOSIS — E790 Hyperuricemia without signs of inflammatory arthritis and tophaceous disease: Secondary | ICD-10-CM | POA: Diagnosis not present

## 2021-05-04 DIAGNOSIS — M10031 Idiopathic gout, right wrist: Secondary | ICD-10-CM | POA: Diagnosis not present

## 2021-05-04 DIAGNOSIS — E790 Hyperuricemia without signs of inflammatory arthritis and tophaceous disease: Secondary | ICD-10-CM | POA: Diagnosis not present

## 2021-05-04 DIAGNOSIS — I10 Essential (primary) hypertension: Secondary | ICD-10-CM | POA: Diagnosis not present

## 2021-05-04 DIAGNOSIS — D649 Anemia, unspecified: Secondary | ICD-10-CM | POA: Diagnosis not present

## 2021-05-05 ENCOUNTER — Encounter: Payer: Self-pay | Admitting: Gastroenterology

## 2021-06-08 ENCOUNTER — Ambulatory Visit: Payer: BC Managed Care – PPO | Admitting: Gastroenterology

## 2021-07-06 ENCOUNTER — Ambulatory Visit: Payer: Self-pay | Admitting: Gastroenterology

## 2021-07-11 ENCOUNTER — Encounter: Payer: Self-pay | Admitting: Internal Medicine

## 2021-08-28 HISTORY — PX: NECK SURGERY: SHX720

## 2021-10-11 DIAGNOSIS — H59812 Chorioretinal scars after surgery for detachment, left eye: Secondary | ICD-10-CM | POA: Diagnosis not present

## 2021-10-11 DIAGNOSIS — H26492 Other secondary cataract, left eye: Secondary | ICD-10-CM | POA: Diagnosis not present

## 2021-10-11 DIAGNOSIS — H2511 Age-related nuclear cataract, right eye: Secondary | ICD-10-CM | POA: Diagnosis not present

## 2021-11-04 DIAGNOSIS — Z961 Presence of intraocular lens: Secondary | ICD-10-CM | POA: Diagnosis not present

## 2021-11-04 DIAGNOSIS — H18413 Arcus senilis, bilateral: Secondary | ICD-10-CM | POA: Diagnosis not present

## 2021-11-04 DIAGNOSIS — H2511 Age-related nuclear cataract, right eye: Secondary | ICD-10-CM | POA: Diagnosis not present

## 2021-11-04 DIAGNOSIS — H26492 Other secondary cataract, left eye: Secondary | ICD-10-CM | POA: Diagnosis not present

## 2021-11-22 DIAGNOSIS — H40022 Open angle with borderline findings, high risk, left eye: Secondary | ICD-10-CM | POA: Diagnosis not present

## 2021-12-20 DIAGNOSIS — H02834 Dermatochalasis of left upper eyelid: Secondary | ICD-10-CM | POA: Diagnosis not present

## 2021-12-20 DIAGNOSIS — H02831 Dermatochalasis of right upper eyelid: Secondary | ICD-10-CM | POA: Diagnosis not present

## 2021-12-20 DIAGNOSIS — H40022 Open angle with borderline findings, high risk, left eye: Secondary | ICD-10-CM | POA: Diagnosis not present

## 2021-12-22 ENCOUNTER — Encounter (HOSPITAL_COMMUNITY): Payer: Self-pay | Admitting: Emergency Medicine

## 2021-12-22 ENCOUNTER — Emergency Department (HOSPITAL_COMMUNITY)
Admission: EM | Admit: 2021-12-22 | Discharge: 2021-12-22 | Disposition: A | Discharge status: Home / Self Care | Payer: BC Managed Care – PPO | Attending: Emergency Medicine | Admitting: Emergency Medicine

## 2021-12-22 ENCOUNTER — Other Ambulatory Visit: Payer: Self-pay

## 2021-12-22 DIAGNOSIS — Z79899 Other long term (current) drug therapy: Secondary | ICD-10-CM | POA: Diagnosis not present

## 2021-12-22 DIAGNOSIS — I10 Essential (primary) hypertension: Secondary | ICD-10-CM | POA: Diagnosis not present

## 2021-12-22 DIAGNOSIS — M545 Low back pain, unspecified: Secondary | ICD-10-CM | POA: Insufficient documentation

## 2021-12-22 DIAGNOSIS — M542 Cervicalgia: Secondary | ICD-10-CM | POA: Diagnosis not present

## 2021-12-22 DIAGNOSIS — Y92410 Unspecified street and highway as the place of occurrence of the external cause: Secondary | ICD-10-CM | POA: Diagnosis not present

## 2021-12-22 DIAGNOSIS — S0001XA Abrasion of scalp, initial encounter: Secondary | ICD-10-CM | POA: Insufficient documentation

## 2021-12-22 DIAGNOSIS — S0990XA Unspecified injury of head, initial encounter: Secondary | ICD-10-CM | POA: Diagnosis not present

## 2021-12-22 MED ORDER — IBUPROFEN 600 MG PO TABS: 600.0000 mg | ORAL_TABLET | Freq: Four times a day (QID) | ORAL | 0 refills | Status: DC | PRN

## 2021-12-22 MED ORDER — CYCLOBENZAPRINE HCL 10 MG PO TABS: 10.0000 mg | ORAL_TABLET | Freq: Two times a day (BID) | ORAL | 0 refills | Status: DC | PRN
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
## 2021-12-22 NOTE — ED Notes (Signed)
I provided reinforced discharge education based off of discharge instructions. Pt acknowledged and understood my education. Pt had no further questions/concerns for provider/myself.  °

## 2022-06-22 DIAGNOSIS — Z961 Presence of intraocular lens: Secondary | ICD-10-CM | POA: Diagnosis not present

## 2022-06-22 DIAGNOSIS — H2511 Age-related nuclear cataract, right eye: Secondary | ICD-10-CM | POA: Diagnosis not present

## 2022-06-22 DIAGNOSIS — H40022 Open angle with borderline findings, high risk, left eye: Secondary | ICD-10-CM | POA: Diagnosis not present

## 2022-06-22 DIAGNOSIS — H02834 Dermatochalasis of left upper eyelid: Secondary | ICD-10-CM | POA: Diagnosis not present

## 2022-06-22 DIAGNOSIS — H18413 Arcus senilis, bilateral: Secondary | ICD-10-CM | POA: Diagnosis not present

## 2022-06-22 DIAGNOSIS — H02831 Dermatochalasis of right upper eyelid: Secondary | ICD-10-CM | POA: Diagnosis not present

## 2022-10-23 ENCOUNTER — Encounter: Payer: Self-pay | Admitting: Family Medicine

## 2022-10-23 ENCOUNTER — Ambulatory Visit (INDEPENDENT_AMBULATORY_CARE_PROVIDER_SITE_OTHER): Payer: BC Managed Care – PPO | Admitting: Family Medicine

## 2022-10-23 VITALS — BP 124/80 | HR 57 | Temp 98.0°F | Ht 73.75 in | Wt 229.5 lb

## 2022-10-23 DIAGNOSIS — R062 Wheezing: Secondary | ICD-10-CM | POA: Diagnosis not present

## 2022-10-23 DIAGNOSIS — Z86711 Personal history of pulmonary embolism: Secondary | ICD-10-CM | POA: Diagnosis not present

## 2022-10-23 DIAGNOSIS — R202 Paresthesia of skin: Secondary | ICD-10-CM | POA: Diagnosis not present

## 2022-10-23 DIAGNOSIS — M79601 Pain in right arm: Secondary | ICD-10-CM

## 2022-10-23 DIAGNOSIS — I10 Essential (primary) hypertension: Secondary | ICD-10-CM

## 2022-10-23 DIAGNOSIS — M79602 Pain in left arm: Secondary | ICD-10-CM

## 2022-10-23 MED ORDER — ALBUTEROL SULFATE HFA 108 (90 BASE) MCG/ACT IN AERS: 2.0000 | INHALATION_SPRAY | Freq: Four times a day (QID) | RESPIRATORY_TRACT | 0 refills | Status: DC | PRN

## 2022-10-23 NOTE — Patient Instructions (Signed)
Check blood pressure every day -- your goal should be anything less than 140/90

## 2022-10-23 NOTE — Progress Notes (Unsigned)
New Patient Office Visit  Subjective    Patient ID: Erik Bolam., male    DOB: 1966-01-28  Age: 57 y.o. MRN: [id number]  CC:  Chief Complaint  Patient presents with   Establish Care   Numbness    Patient complains of numbness intermittently bilateral hands x6 months, no known injury    HPI [name]. presents to establish care Pt is here with new symptoms, numbness in the fingertips both hands, has been going on for the last 6 months. States that it happens at work in the mornings a lot. States that he keeps moving his fingers in order to get the circulation back.   Pt reports that at night he has been having coughing spells, states it happens mostly at night, states that he feels like there is some congestion in his chest. States that it happens sometimes when he eats or drinks something, occasionally productive of phlegm. No prior history of lung disease or smoking.   Patient has a history of blood clot in his right leg, then had a stent placed in his  Outpatient Encounter Medications as of 10/23/2022  Medication Sig   albuterol (VENTOLIN HFA) 108 (90 Base) MCG/ACT inhaler Inhale 2 puffs into the lungs every 6 (six) hours as needed for wheezing or shortness of breath.   ibuprofen (ADVIL) 600 MG tablet Take 1 tablet (600 mg total) by mouth every 6 (six) hours as needed.   [DISCONTINUED] cyclobenzaprine (FLEXERIL) 10 MG tablet Take 1 tablet (10 mg total) by mouth 2 (two) times daily as needed for muscle spasms.   [DISCONTINUED] doxycycline (VIBRAMYCIN) 100 MG capsule Take 1 capsule (100 mg total) by mouth 2 (two) times daily. One po bid x 7 days   [DISCONTINUED] losartan-hydrochlorothiazide (HYZAAR) 50-12.5 MG tablet Take 1 tablet by mouth daily.   [DISCONTINUED] sildenafil (VIAGRA) 25 MG tablet Take 1 tablet (25 mg total) by mouth daily as needed for erectile dysfunction. If needed, can take 2 at a time if 1 does not work   No facility-administered encounter medications  on file as of 10/23/2022.    Past Medical History:  Diagnosis Date   Hypertension     Past Surgical History:  Procedure Laterality Date   ENDOVENOUS ABLATION SAPHENOUS VEIN W/ LASER  01/08/2013   left leg for chronic superficial venous indufficiency, by Dr. Renaldo Reel    TOTAL HIP ARTHROPLASTY Right 2021    Family History  Problem Relation Age of Onset   Cancer Mother    Hypertension Father    Cancer Sister    Hypertension Brother     Social History   Socioeconomic History   Marital status: Divorced    Spouse name: Not on file   Number of children: Not on file   Years of education: Not on file   Highest education level: Not on file  Occupational History   Not on file  Tobacco Use   Smoking status: Never   Smokeless tobacco: Never  Vaping Use   Vaping Use: Never used  Substance and Sexual Activity   Alcohol use: Yes    Comment: socially   Drug use: Never   Sexual activity: Yes  Other Topics Concern   Not on file  Social History Narrative   Lives Alone    Works as Cabin crew   Social Determinants of Health   Financial Resource Strain: Not on file  Food Insecurity: Not on file  Transportation Needs: Not on file  Physical Activity:  Not on file  Stress: Not on file  Social Connections: Not on file  Intimate Partner Violence: Not on file    Review of Systems  All other systems reviewed and are negative.       Objective    BP 124/80 (BP Location: Right Arm, Patient Position: Sitting, Cuff Size: Large)   Pulse (!) 57   Temp 98 F (36.7 C) (Oral)   Ht 6' 1.75" (1.873 m)   Wt 229 lb 8 oz (104.1 kg)   SpO2 99%   BMI 29.67 kg/m   Physical Exam Vitals reviewed.  Constitutional:      Appearance: Normal appearance. He is well-groomed and normal weight.  Eyes:     Extraocular Movements: Extraocular movements intact.     Conjunctiva/sclera: Conjunctivae normal.  Neck:     Thyroid: No thyromegaly.  Cardiovascular:     Rate and Rhythm: Normal  rate and regular rhythm.     Heart sounds: S1 normal and S2 normal. No murmur heard. Pulmonary:     Effort: Pulmonary effort is normal.     Breath sounds: Normal breath sounds and air entry. No rales.  Abdominal:     General: Abdomen is flat. Bowel sounds are normal.  Musculoskeletal:     Right lower leg: No edema.     Left lower leg: No edema.  Neurological:     General: No focal deficit present.     Mental Status: He is alert and oriented to person, place, and time.     Gait: Gait is intact.  Psychiatric:        Mood and Affect: Mood and affect normal.     {Labs (Optional):23779}    Assessment & Plan:   Problem List Items Addressed This Visit       Unprioritized   ESSENTIAL HYPERTENSION, BENIGN   History of pulmonary embolus (PE)   Wheezing   Relevant Medications   albuterol (VENTOLIN HFA) 108 (90 Base) MCG/ACT inhaler   Other Visit Diagnoses     Paresthesia and pain of both upper extremities    -  Primary   Relevant Orders   CMP   CBC (no diff)   Lipid Panel   Hemoglobin A1c   B12 and Folate Panel       No follow-ups on file.   Farrel Conners, MD

## 2022-10-24 DIAGNOSIS — M79601 Pain in right arm: Secondary | ICD-10-CM | POA: Insufficient documentation

## 2022-10-24 NOTE — Assessment & Plan Note (Signed)
Unclear etiology, could be Raynauds or vascular in origin, or could be neuropathic, will start work up with extensive blood work to include CMP, CBC, lipid panel, A1C and B12 and folate levels. Further testing TBD after the initial results are known.

## 2022-10-24 NOTE — Assessment & Plan Note (Signed)
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 He will likely need CT of chest or PFT's for a definitive diagnosis.

## 2022-10-24 NOTE — Assessment & Plan Note (Signed)
BP is normal today in office, I recommended he start checking his BP at home regularly to see if the BP medication needs to be restarted.

## 2022-11-01 ENCOUNTER — Other Ambulatory Visit (INDEPENDENT_AMBULATORY_CARE_PROVIDER_SITE_OTHER): Payer: BC Managed Care – PPO

## 2022-11-01 DIAGNOSIS — M79602 Pain in left arm: Secondary | ICD-10-CM

## 2022-11-01 DIAGNOSIS — M79601 Pain in right arm: Secondary | ICD-10-CM

## 2022-11-01 DIAGNOSIS — R202 Paresthesia of skin: Secondary | ICD-10-CM | POA: Diagnosis not present

## 2022-11-01 LAB — B12 AND FOLATE PANEL
Folate: 4.8 ng/mL — ABNORMAL LOW (ref >5.9)
Vitamin B-12: 475 pg/mL (ref 211–911)

## 2022-11-01 LAB — CBC
HCT: 36.1 % — ABNORMAL LOW (ref 39.0–52.0)
Hemoglobin: 12.1 g/dL — ABNORMAL LOW (ref 13.0–17.0)
MCHC: 33.4 g/dL (ref 30.0–36.0)
MCV: 82 fl (ref 78.0–100.0)
Platelets: 249 10*3/uL (ref 150.0–400.0)
RBC: 4.41 Mil/uL (ref 4.22–5.81)
RDW: 13.3 % (ref 11.5–15.5)
WBC: 4.3 10*3/uL (ref 4.0–10.5)

## 2022-11-01 LAB — COMPREHENSIVE METABOLIC PANEL
ALT: 13 U/L (ref 0–53)
AST: 15 U/L (ref 0–37)
Albumin: 3.8 g/dL (ref 3.5–5.2)
Alkaline Phosphatase: 103 U/L (ref 39–117)
BUN: 14 mg/dL (ref 6–23)
CO2: 26 mEq/L (ref 19–32)
Calcium: 9.5 mg/dL (ref 8.4–10.5)
Chloride: 103 mEq/L (ref 96–112)
Creatinine, Ser: 0.99 mg/dL (ref 0.40–1.50)
GFR: 86.12 mL/min (ref >60.00)
Glucose, Bld: 81 mg/dL (ref 70–99)
Potassium: 4 mEq/L (ref 3.5–5.1)
Sodium: 138 mEq/L (ref 135–145)
Total Bilirubin: 1.1 mg/dL (ref 0.2–1.2)
Total Protein: 7.4 g/dL (ref 6.0–8.3)

## 2022-11-01 LAB — LIPID PANEL
Cholesterol: 150 mg/dL (ref 0–200)
HDL: 55 mg/dL (ref >39.00)
LDL Cholesterol: 77 mg/dL (ref 0–99)
NonHDL: 95.14
Total CHOL/HDL Ratio: 3
Triglycerides: 89 mg/dL (ref 0.0–149.0)
VLDL: 17.8 mg/dL (ref 0.0–40.0)

## 2022-11-01 LAB — HEMOGLOBIN A1C: Hgb A1c MFr Bld: 5.7 % (ref 4.6–6.5)

## 2022-11-14 ENCOUNTER — Telehealth: Payer: Self-pay | Admitting: Family Medicine

## 2022-11-14 NOTE — Telephone Encounter (Signed)
Error

## 2022-11-18 ENCOUNTER — Other Ambulatory Visit: Payer: Self-pay | Admitting: Family Medicine

## 2022-11-18 DIAGNOSIS — R062 Wheezing: Secondary | ICD-10-CM

## 2022-12-11 ENCOUNTER — Encounter: Payer: Self-pay | Admitting: Family Medicine

## 2022-12-11 ENCOUNTER — Ambulatory Visit (INDEPENDENT_AMBULATORY_CARE_PROVIDER_SITE_OTHER): Payer: BC Managed Care – PPO | Admitting: Family Medicine

## 2022-12-11 VITALS — BP 118/78 | HR 80 | Temp 98.7°F | Ht 73.75 in | Wt 225.3 lb

## 2022-12-11 DIAGNOSIS — R053 Chronic cough: Secondary | ICD-10-CM | POA: Diagnosis not present

## 2022-12-11 DIAGNOSIS — E538 Deficiency of other specified B group vitamins: Secondary | ICD-10-CM

## 2022-12-11 DIAGNOSIS — N529 Male erectile dysfunction, unspecified: Secondary | ICD-10-CM

## 2022-12-11 DIAGNOSIS — Z1211 Encounter for screening for malignant neoplasm of colon: Secondary | ICD-10-CM

## 2022-12-11 DIAGNOSIS — Z125 Encounter for screening for malignant neoplasm of prostate: Secondary | ICD-10-CM

## 2022-12-11 DIAGNOSIS — R062 Wheezing: Secondary | ICD-10-CM

## 2022-12-11 DIAGNOSIS — I10 Essential (primary) hypertension: Secondary | ICD-10-CM

## 2022-12-11 MED ORDER — SILDENAFIL CITRATE 100 MG PO TABS: 50.0000 mg | ORAL_TABLET | Freq: Every day | ORAL | 0 refills | Status: DC | PRN

## 2022-12-11 MED ORDER — FOLIC ACID 1 MG PO TABS: 1.0000 mg | ORAL_TABLET | Freq: Every day | ORAL | 3 refills | Status: DC

## 2022-12-11 MED ORDER — FAMOTIDINE 20 MG PO TABS
20.0000 mg | ORAL_TABLET | Freq: Two times a day (BID) | ORAL | 0 refills | Status: DC | PRN
Start: 2022-12-11 — End: 2024-04-10

## 2022-12-11 MED ORDER — AMLODIPINE BESYLATE 10 MG PO TABS: 10.0000 mg | ORAL_TABLET | Freq: Every day | ORAL | 1 refills | Status: DC

## 2022-12-11 NOTE — Progress Notes (Unsigned)
Established Patient Office Visit  Subjective   Patient ID: Erik Manne., male    DOB: 1965-10-14  Age: 57 y.o. MRN: [id number]  Chief Complaint  Patient presents with   Medical Management of Chronic Issues    Pt is here for follow up  Wheezing-- pt reports that he used the albuterol inhaler but it was not helpful with the cough. States that he thinks that the coughing is mostly after a meal, states that sometimes it feels like it is going down "the wrong pipe". No choking however.   BP-- pt states that he has not been checking his BP at home, states that he has not had any symptoms currently, still on on the medication, taking it as prescribed.   Pt is reporting a problem with being able to maintain an erection. States that he is losing his erection before ejaculation, states that it happens pretty much every time. Is asking about viagra for this.    Current Outpatient Medications  Medication Instructions   albuterol (VENTOLIN HFA) 108 (90 Base) MCG/ACT inhaler INHALE 2 PUFFS BY MOUTH EVERY 6 HOURS AS NEEDED FOR WHEEZING OR SHORTNESS OF BREATH   amLODipine (NORVASC) 10 mg, Oral, Daily   famotidine (PEPCID) 20 mg, Oral, 2 times daily PRN   folic acid (FOLVITE) 1 mg, Oral, Daily   ibuprofen (ADVIL) 600 mg, Oral, Every 6 hours PRN   sildenafil (VIAGRA) 50 mg, Oral, Daily PRN    Patient Active Problem List   Diagnosis Date Noted   Paresthesia and pain of both upper extremities 10/24/2022   History of pulmonary embolus (PE) 10/23/2022   Wheezing 10/23/2022   Low vision left eye category 1, normal vision right eye 06/03/2019   DJD (degenerative joint disease) 06/03/2019   Healthcare maintenance 10/21/2014   Chronic kidney disease 11/28/2012   Essential hypertension, benign 01/27/2009      Review of Systems  All other systems reviewed and are negative.     Objective:     BP 118/78 (BP Location: Right Arm, Patient Position: Sitting, Cuff Size: Large)   Pulse 80    Temp 98.7 F (37.1 C) (Oral)   Ht 6' 1.75" (1.873 m)   Wt 225 lb 4.8 oz (102.2 kg)   SpO2 97%   BMI 29.12 kg/m    Physical Exam Vitals reviewed.  Constitutional:      Appearance: Normal appearance. He is well-groomed and normal weight.  Eyes:     Extraocular Movements: Extraocular movements intact.     Conjunctiva/sclera: Conjunctivae normal.  Neck:     Thyroid: No thyromegaly.  Cardiovascular:     Rate and Rhythm: Normal rate and regular rhythm.     Heart sounds: S1 normal and S2 normal. No murmur heard. Pulmonary:     Effort: Pulmonary effort is normal.     Breath sounds: Normal breath sounds and air entry. No rales.  Musculoskeletal:     Right lower leg: No edema.     Left lower leg: No edema.  Neurological:     General: No focal deficit present.     Mental Status: He is alert and oriented to person, place, and time.     Gait: Gait is intact.  Psychiatric:        Mood and Affect: Mood and affect normal.      No results found for any visits on 12/11/22.    The 10-year ASCVD risk score (Arnett DK, et al., 2019) is: 8.5%    Assessment &  Plan:   Problem List Items Addressed This Visit       Unprioritized   Essential hypertension, benign    BP controlled today on amlodipine 10 mg daily, will continue this medication      Relevant Medications   sildenafil (VIAGRA) 100 MG tablet   amLODipine (NORVASC) 10 MG tablet   Wheezing    Wheezing resolved on today's exam, however pt continue to report coughing especially with food, I question maybe aicd reflux? Will send in pepcid 20 mg BID PRN.      Other Visit Diagnoses     Erectile dysfunction, unspecified erectile dysfunction type    -  Primary   could be secondary to HTN, will call in sildenafil 50 mg 1 tablet before the onset of activity.   Relevant Medications   sildenafil (VIAGRA) 100 MG tablet   Folic acid deficiency       history of, will refill the folic acid 1 mg supplement daily   Relevant  Medications   folic acid (FOLVITE) 1 MG tablet   Chronic cough       inhaler did not improve the cough , but the wheezing is gone today on exam. will treat with pepcid 20 mg BID   Relevant Medications   famotidine (PEPCID) 20 MG tablet   Colon cancer screening       Relevant Orders   Ambulatory referral to Gastroenterology   Screening for prostate cancer       Relevant Orders   PSA       Return in about 6 months (around 06/12/2023) for HTN.    Karie Georges, MD

## 2022-12-12 ENCOUNTER — Encounter: Payer: Self-pay | Admitting: Gastroenterology

## 2022-12-13 NOTE — Assessment & Plan Note (Signed)
Wheezing resolved on today's exam, however pt continue to report coughing especially with food, I question maybe aicd reflux? Will send in pepcid 20 mg BID PRN.

## 2022-12-13 NOTE — Assessment & Plan Note (Signed)
BP controlled today on amlodipine 10 mg daily, will continue this medication

## 2023-01-17 ENCOUNTER — Encounter: Payer: Self-pay | Admitting: Family Medicine

## 2023-01-17 ENCOUNTER — Ambulatory Visit (INDEPENDENT_AMBULATORY_CARE_PROVIDER_SITE_OTHER): Payer: BC Managed Care – PPO | Admitting: Family Medicine

## 2023-01-17 ENCOUNTER — Ambulatory Visit (INDEPENDENT_AMBULATORY_CARE_PROVIDER_SITE_OTHER): Payer: BC Managed Care – PPO

## 2023-01-17 ENCOUNTER — Encounter: Payer: BC Managed Care – PPO | Admitting: Gastroenterology

## 2023-01-17 VITALS — BP 114/84 | HR 64 | Temp 98.8°F | Wt 227.8 lb

## 2023-01-17 DIAGNOSIS — M25562 Pain in left knee: Secondary | ICD-10-CM | POA: Diagnosis not present

## 2023-01-17 DIAGNOSIS — Z86711 Personal history of pulmonary embolism: Secondary | ICD-10-CM | POA: Diagnosis not present

## 2023-01-17 DIAGNOSIS — M79669 Pain in unspecified lower leg: Secondary | ICD-10-CM

## 2023-01-17 LAB — D-DIMER, QUANTITATIVE: D-Dimer, Quant: 0.73 mcg/mL FEU — ABNORMAL HIGH (ref <0.50)

## 2023-01-17 MED ORDER — TRAMADOL HCL 50 MG PO TABS
50.0000 mg | ORAL_TABLET | Freq: Three times a day (TID) | ORAL | 0 refills | Status: AC | PRN
Start: 2023-01-17 — End: 2023-01-22

## 2023-01-17 NOTE — Patient Instructions (Signed)
Orders for an x-ray and labs were placed for you to have done while you are here in clinic.  An order for an ultrasound of your leg to further evaluate for a blood clot was also placed.  You should expect a phone call about scheduling this appointment.

## 2023-01-17 NOTE — Progress Notes (Signed)
Established Patient Office Visit   Subjective  Patient ID: [name]., male    DOB: [birth year]  Age: 57 y.o. MRN: [id number]  Chief Complaint  Patient presents with  . Joint Swelling    Left knee swollen, nothing happened before the swelling started. Not ale to bend, bending causes pain. Just usually keeps it straight. Nothing is helping    Pt is a 57 yo male with pmh sig for HTN, h/o PE, CKD, DJD who is followed by Dr. Casimiro Needle and seen for acute concerns.  Pt with L knee pain and edema x 2-3 days.  Denies injury or increased activity.  Pain worse at night.  Tried knee brace this am.     Past Medical History:  Diagnosis Date  . Hypertension    Past Surgical History:  Procedure Laterality Date  . ENDOVENOUS ABLATION SAPHENOUS VEIN W/ LASER  01/08/2013   left leg for chronic superficial venous indufficiency, by Dr. Ardyth Gal   . TOTAL HIP ARTHROPLASTY Right 2021   Family History  Problem Relation Age of Onset  . Cancer Mother   . Hypertension Father   . Cancer Sister   . Hypertension Brother    No Known Allergies    ROS Negative unless stated above    Objective:     BP 114/84 (BP Location: Right Arm, Patient Position: Sitting, Cuff Size: Large)   Pulse 64   Temp 98.8 F (37.1 C) (Oral)   Wt 227 lb 12.8 oz (103.3 kg)   SpO2 95%   BMI 29.45 kg/m  {Vitals History (Optional):23777}  Physical Exam Constitutional:      General: He is not in acute distress.    Appearance: Normal appearance.  HENT:     Head: Normocephalic and atraumatic.     Nose: Nose normal.     Mouth/Throat:     Mouth: Mucous membranes are moist.  Cardiovascular:     Rate and Rhythm: Normal rate and regular rhythm.     Heart sounds: Normal heart sounds. No murmur heard.    No gallop.  Pulmonary:     Effort: Pulmonary effort is normal. No respiratory distress.     Breath sounds: Normal breath sounds. No wheezing, rhonchi or rales.  Musculoskeletal:     Right knee: Normal.      Left knee: Decreased range of motion.       Legs:     Comments: L calf tenderness.  Decreased ROM L knee flexion limited, 55 degrees.  No jt line tenderness.  No erythema or increased warmth.  +Homan's sign.  TTP of medial L leg inferior to l knee.    Skin:    General: Skin is warm and dry.  Neurological:     Mental Status: He is alert and oriented to person, place, and time.     No results found for any visits on 01/17/23.    Assessment & Plan:  Acute pain of left knee -     D-dimer, quantitative -     VAS [place] LOWER EXTREMITY VENOUS (DVT); Future -     DG Knee Complete 4 Views Left -     CBC with Differential/Platelet -     traMADol HCl; Take 1 tablet (50 mg total) by mouth every 8 (eight) hours as needed for up to 5 days.  Dispense: 15 tablet; Refill: 0  Calf tenderness -     D-dimer, quantitative -     VAS [place] LOWER EXTREMITY VENOUS (DVT); Future  History of pulmonary embolus (PE) -     VAS [place] LOWER EXTREMITY VENOUS (DVT); Future    Return if symptoms worsen or fail to improve.   Deeann Saint, MD

## 2023-01-18 ENCOUNTER — Telehealth: Payer: Self-pay | Admitting: Family Medicine

## 2023-01-18 ENCOUNTER — Telehealth (HOSPITAL_BASED_OUTPATIENT_CLINIC_OR_DEPARTMENT_OTHER): Payer: Self-pay | Admitting: *Deleted

## 2023-01-18 NOTE — Telephone Encounter (Signed)
Left message for patient to call and schedule the lower extremity venous doppler ordered by Dr. Abbe Amsterdam

## 2023-01-18 NOTE — Telephone Encounter (Signed)
Patient called by this provider regarding elevated D-dimer results.  Doppler ultrasound ordered yesterday however patient has yet to hear back about scheduling.  Patient advised to proceed to nearest ED for further evaluation given history of PE with IVC filter in place, no longer on anticoagulation.  Patient expressed understanding.  Abbe Amsterdam, MD

## 2023-01-19 ENCOUNTER — Telehealth: Payer: Self-pay

## 2023-01-19 ENCOUNTER — Ambulatory Visit (HOSPITAL_COMMUNITY)
Admission: RE | Admit: 2023-01-19 | Discharge: 2023-01-19 | Disposition: A | Discharge status: Home / Self Care | Payer: BC Managed Care – PPO | Source: Ambulatory Visit | Attending: Family Medicine | Admitting: Family Medicine

## 2023-01-19 DIAGNOSIS — M79669 Pain in unspecified lower leg: Secondary | ICD-10-CM | POA: Insufficient documentation

## 2023-01-19 DIAGNOSIS — Z86711 Personal history of pulmonary embolism: Secondary | ICD-10-CM | POA: Diagnosis not present

## 2023-01-19 DIAGNOSIS — M25562 Pain in left knee: Secondary | ICD-10-CM | POA: Diagnosis not present

## 2023-01-19 LAB — CBC WITH DIFFERENTIAL/PLATELET
Basophils Absolute: 0 10*3/uL (ref 0.0–0.1)
Basophils Relative: 0.9 % (ref 0.0–3.0)
Eosinophils Absolute: 0.5 10*3/uL (ref 0.0–0.7)
Eosinophils Relative: 11.5 % — ABNORMAL HIGH (ref 0.0–5.0)
HCT: 36.6 % — ABNORMAL LOW (ref 39.0–52.0)
Hemoglobin: 11.8 g/dL — ABNORMAL LOW (ref 13.0–17.0)
Lymphocytes Relative: 23.1 % (ref 12.0–46.0)
Lymphs Abs: 1 10*3/uL (ref 0.7–4.0)
MCHC: 32.2 g/dL (ref 30.0–36.0)
MCV: 83.5 fl (ref 78.0–100.0)
Monocytes Absolute: 0.4 10*3/uL (ref 0.1–1.0)
Monocytes Relative: 7.8 % (ref 3.0–12.0)
Neutro Abs: 2.6 10*3/uL (ref 1.4–7.7)
Neutrophils Relative %: 56.7 % (ref 43.0–77.0)
Platelets: 279 10*3/uL (ref 150.0–400.0)
RBC: 4.39 Mil/uL (ref 4.22–5.81)
RDW: 13.8 % (ref 11.5–15.5)
WBC: 4.5 10*3/uL (ref 4.0–10.5)

## 2023-01-19 NOTE — Telephone Encounter (Signed)
Ok

## 2023-01-19 NOTE — Progress Notes (Signed)
Bilateral lower extremity venous duplex has been completed. Preliminary results can be found in CV Proc through chart review.  Results were given to Taylasia at Dr. Salomon Fick' office.  01/19/23 3:02 PM Olen Cordial RVT

## 2023-01-23 ENCOUNTER — Ambulatory Visit: Payer: BC Managed Care – PPO | Admitting: Family Medicine

## 2023-02-26 ENCOUNTER — Encounter (HOSPITAL_COMMUNITY): Payer: Self-pay | Admitting: Emergency Medicine

## 2023-02-26 ENCOUNTER — Emergency Department (HOSPITAL_COMMUNITY)
Admission: EM | Admit: 2023-02-26 | Discharge: 2023-02-26 | Disposition: A | Discharge status: Home / Self Care | Payer: BC Managed Care – PPO | Attending: Emergency Medicine | Admitting: Emergency Medicine

## 2023-02-26 ENCOUNTER — Emergency Department (HOSPITAL_COMMUNITY): Payer: BC Managed Care – PPO

## 2023-02-26 DIAGNOSIS — W132XXA Fall from, out of or through roof, initial encounter: Secondary | ICD-10-CM | POA: Diagnosis not present

## 2023-02-26 DIAGNOSIS — M79645 Pain in left finger(s): Secondary | ICD-10-CM

## 2023-02-26 DIAGNOSIS — M25551 Pain in right hip: Secondary | ICD-10-CM | POA: Diagnosis not present

## 2023-02-26 DIAGNOSIS — I129 Hypertensive chronic kidney disease with stage 1 through stage 4 chronic kidney disease, or unspecified chronic kidney disease: Secondary | ICD-10-CM | POA: Diagnosis not present

## 2023-02-26 DIAGNOSIS — S9002XA Contusion of left ankle, initial encounter: Secondary | ICD-10-CM | POA: Insufficient documentation

## 2023-02-26 DIAGNOSIS — W19XXXA Unspecified fall, initial encounter: Secondary | ICD-10-CM

## 2023-02-26 DIAGNOSIS — M79642 Pain in left hand: Secondary | ICD-10-CM | POA: Diagnosis not present

## 2023-02-26 DIAGNOSIS — M19072 Primary osteoarthritis, left ankle and foot: Secondary | ICD-10-CM | POA: Diagnosis not present

## 2023-02-26 DIAGNOSIS — N189 Chronic kidney disease, unspecified: Secondary | ICD-10-CM | POA: Insufficient documentation

## 2023-02-26 DIAGNOSIS — M25572 Pain in left ankle and joints of left foot: Secondary | ICD-10-CM

## 2023-02-26 DIAGNOSIS — Z79899 Other long term (current) drug therapy: Secondary | ICD-10-CM | POA: Diagnosis not present

## 2023-02-26 MED ORDER — METHOCARBAMOL 500 MG PO TABS: 500.0000 mg | ORAL_TABLET | Freq: Two times a day (BID) | ORAL | 0 refills | Status: DC

## 2023-02-26 MED ORDER — HYDROCODONE-ACETAMINOPHEN 5-325 MG PO TABS
1.0000 | ORAL_TABLET | Freq: Once | ORAL | Status: AC
  Administered 2023-02-26: 1 via ORAL
  Filled 2023-02-26: qty 1

## 2023-02-26 NOTE — ED Provider Notes (Signed)
[place] EMERGENCY DEPARTMENT AT [facility] Provider Note   CSN: [id number] Arrival date & time: 02/26/23  4098     History  Chief Complaint  Patient presents with   [name] [name]. is a 57 y.o. male.  Patient with history of hypertension, CKD, PE not currently on anticoagulation presents today with complaints of fall.  He states that same occurred yesterday around 11 AM when he fell off a roof.  He states that the he was standing with one foot on the ladder and one was on the roof when the ladder shifted and he fell backwards. His left ankle hung in the ladder for a second before releasing and he subsequently fell to the ground on his back which struck the concrete. He did not his his head or loose consciousness.  He is not anticoagulated.  He was able to get off the ground and walk around since. He is endorsing left ankle pain and left hand pain. He also notes some soreness in his right hip that he had replaced 4 years ago as well as his bilateral upper shoulders. Denies any increased pain with range of motion. Denies any headache, neck pain, chest pain, shortness of breath, nausea, vomiting, or abdominal pain.  The history is provided by the patient. No language interpreter was used.  Fall       Home Medications Prior to Admission medications   Medication Sig Start Date End Date Taking? Authorizing Provider  albuterol (VENTOLIN HFA) 108 (90 Base) MCG/ACT inhaler INHALE 2 PUFFS BY MOUTH EVERY 6 HOURS AS NEEDED FOR WHEEZING OR SHORTNESS OF BREATH 11/20/22   Karie Georges, MD  amLODipine (NORVASC) 10 MG tablet Take 1 tablet (10 mg total) by mouth daily. 12/11/22   Karie Georges, MD  famotidine (PEPCID) 20 MG tablet Take 1 tablet (20 mg total) by mouth 2 (two) times daily as needed for heartburn or indigestion. 12/11/22   Karie Georges, MD  folic acid (FOLVITE) 1 MG tablet Take 1 tablet (1 mg total) by mouth daily. 12/11/22   Karie Georges, MD   ibuprofen (ADVIL) 600 MG tablet Take 1 tablet (600 mg total) by mouth every 6 (six) hours as needed. 12/22/21   Fayrene Helper, PA-C  sildenafil (VIAGRA) 100 MG tablet Take 0.5 tablets (50 mg total) by mouth daily as needed for erectile dysfunction. 12/11/22   Karie Georges, MD      Allergies    Patient has no known allergies.    Review of Systems   Review of Systems  Musculoskeletal:  Positive for arthralgias and myalgias.  All other systems reviewed and are negative.   Physical Exam Updated Vital Signs BP (!) 155/101   Pulse (!) 58   Temp 98 F (36.7 C) (Oral)   Resp 18   Ht 6\' 2"  (1.88 m)   Wt 102.1 kg   SpO2 99%   BMI 28.89 kg/m  Physical Exam Vitals and nursing note reviewed.  Constitutional:      General: He is not in acute distress.    Appearance: Normal appearance. He is normal weight. He is not ill-appearing, toxic-appearing or diaphoretic.  HENT:     Head: Normocephalic and atraumatic.     Comments: No racoon eyes No battle sign Cardiovascular:     Rate and Rhythm: Normal rate.  Pulmonary:     Effort: Pulmonary effort is normal. No respiratory distress.     Breath sounds: Normal breath  sounds.  Chest:     Comments: No bruising or TTP of the chest wall Abdominal:     General: Abdomen is flat.     Palpations: Abdomen is soft.     Tenderness: There is no abdominal tenderness.  Musculoskeletal:        General: Normal range of motion.     Cervical back: Normal, normal range of motion and neck supple.     Thoracic back: Normal.     Lumbar back: Normal.     Comments: No midline tenderness, no stepoffs or deformity noted on palpation of cervical, thoracic, and lumbar spine.  General muscle soreness noted to the bilateral shoulders with no limitation in ROM. No focal bony tenderness. Radial pulses intact and 2+. No bruising, swelling or overlying skin changes.   TTP of the left middle finger of the left hand. No bruising or deformity. No overlying skin  changes. ROM normal.  Mild TTP of the right hip, no bruising or deformity. ROM intact.   TTP of the left ankle along the ATFL and lateral malleolus. No deformity. ROM limited due to pain. Mild bruising noted. DP and PT pulses intact and 2+  Patient observed to be ambulatory with steady gait.  Skin:    General: Skin is warm and dry.  Neurological:     General: No focal deficit present.     Mental Status: He is alert and oriented to person, place, and time.  Psychiatric:        Mood and Affect: Mood normal.        Behavior: Behavior normal.     ED Results / Procedures / Treatments   Labs (all labs ordered are listed, but only abnormal results are displayed) Labs Reviewed - No data to display  EKG None  Radiology DG Hand Complete Left  Result Date: 02/26/2023 CLINICAL DATA:  Pain after fall EXAM: LEFT HAND - COMPLETE 3 VIEW COMPARISON:  None Available. FINDINGS: No fracture or dislocation. Preserved joint spaces and bone mineralization. Slight areas of degenerative changes such as first metacarpophalangeal joint with some small osteophytes and sclerosis. IMPRESSION: No acute osseous abnormality Electronically Signed   By: Karen Kays M.D.   On: 02/26/2023 10:43   DG Ankle Complete Left  Result Date: 02/26/2023 CLINICAL DATA:  Pain after fall EXAM: LEFT ANKLE COMPLETE - 3 VIEW COMPARISON:  None Available. FINDINGS: Soft tissue swelling about the ankle. Moderate well corticated plantar calcaneal spur. No fracture or dislocation. Preserved joint spaces. There are some well corticated density seen adjacent to the medial malleolus which could be the sequela of remote trauma or accessory ossicles. Mild degenerative changes of the midfoot at the edge of the imaging field on the lateral view. IMPRESSION: Chronic changes.  Mild soft tissue swelling. Electronically Signed   By: Karen Kays M.D.   On: 02/26/2023 10:41    Procedures Procedures    Medications Ordered in ED Medications   HYDROcodone-acetaminophen (NORCO/VICODIN) 5-325 MG per tablet 1 tablet (1 tablet Oral Given 02/26/23 1023)    ED Course/ Medical Decision Making/ A&P                             Medical Decision Making Amount and/or Complexity of Data Reviewed Radiology: ordered.  Risk Prescription drug management.   This patient is a 57 y.o. male  who presents to the ED for concern of fall.   Differential diagnoses prior to evaluation: The emergent differential  diagnosis includes, but is not limited to,  trauma . This is not an exhaustive differential.   Past Medical History / Co-morbidities / Social History: history of hypertension, CKD, PE not currently on anticoagulation  Physical Exam: Physical exam performed. The pertinent findings include: per above, TTP left ankle and left middle finger. No obvious deformity.  Other areas of soreness to the right hip and bilateral shoulders per above, shared decision making with the patient, no indication for imaging at this time  Imaging studies: I personally interpreted labs/imaging and the pertinent results include:  Dg left ankle and left hand.  These have resulted and reveal no acute findings.  I agree with the radiologist interpretation.   Medications: I ordered medication including norco for pain.  Given with improvement.  I have reviewed the patients home medicines and have made adjustments as needed.   Disposition: After consideration of the diagnostic results and the patients response to treatment, I feel that emergency department workup does not suggest an emergent condition requiring admission or immediate intervention beyond what has been performed at this time. The plan is: Discharge with walking boot for suspected ankle sprain and orthopedic referral.  Will also give prescription for Robaxin to help with muscle soreness.  Patient advised not to drive or operate heavy machinery while taking this medication.  Also recommend RICE and  Tylenol/ibuprofen. Evaluation and diagnostic testing in the emergency department does not suggest an emergent condition requiring admission or immediate intervention beyond what has been performed at this time.  Plan for discharge with close PCP follow-up.  Patient is understanding and amenable with plan, educated on red flag symptoms that would prompt immediate return.  Patient discharged in stable condition.  Final Clinical Impression(s) / ED Diagnoses Final diagnoses:  Fall, initial encounter  Acute left ankle pain  Pain of left middle finger    Rx / DC Orders ED Discharge Orders          Ordered    methocarbamol (ROBAXIN) 500 MG tablet  2 times daily        02/26/23 1136          An After Visit Summary was printed and given to the patient.     Silva Bandy, PA-C 02/26/23 1137    Elayne Snare K, DO 02/26/23 1553

## 2023-02-26 NOTE — Discharge Instructions (Addendum)
As we discussed, your workup in the ER today was reassuring for acute findings.  X-ray imaging of your left ankle and left hand did not reveal any emergent concerns.  I do suspect that you may have sustained a sprained ankle and have given you a walking boot to wear to help with this.  I have also given you a referral to orthopedics with a number to call to schedule an appointment for follow-up evaluation and management of this.  Please call them at your earliest convenience.  I have given you a prescription for Robaxin which is a muscle relaxer for you to take as prescribed as needed to help with residual muscle soreness.  Do not drive or operate heavy machinery while taking this medication as it can be sedating.  I also recommend that you rest, ice, compress, and elevate areas that are sore and take Tylenol/ibuprofen as needed for pain.  Return if development of any new or worsening symptoms.

## 2023-02-26 NOTE — ED Triage Notes (Signed)
Pt reports falling off 2 story roof yesterday around 11 am. States one leg was on roof and one was on ladder when he tripped. Denies hitting head of LOC. Complaints of left ankle pain, left hand pain, bilateral shoulder pain and lower back pain. Took advil this morning. Also having right hip soreness and hx of replacement 4 years ago.

## 2023-03-01 ENCOUNTER — Other Ambulatory Visit: Payer: Self-pay

## 2023-03-01 ENCOUNTER — Emergency Department (HOSPITAL_COMMUNITY)
Admission: EM | Admit: 2023-03-01 | Discharge: 2023-03-01 | Disposition: A | Discharge status: Home / Self Care | Payer: BC Managed Care – PPO | Attending: Emergency Medicine | Admitting: Emergency Medicine

## 2023-03-01 ENCOUNTER — Emergency Department (HOSPITAL_COMMUNITY): Payer: BC Managed Care – PPO

## 2023-03-01 ENCOUNTER — Encounter (HOSPITAL_COMMUNITY): Payer: Self-pay

## 2023-03-01 DIAGNOSIS — S8992XA Unspecified injury of left lower leg, initial encounter: Secondary | ICD-10-CM | POA: Diagnosis not present

## 2023-03-01 DIAGNOSIS — I129 Hypertensive chronic kidney disease with stage 1 through stage 4 chronic kidney disease, or unspecified chronic kidney disease: Secondary | ICD-10-CM | POA: Diagnosis not present

## 2023-03-01 DIAGNOSIS — M25562 Pain in left knee: Secondary | ICD-10-CM | POA: Insufficient documentation

## 2023-03-01 DIAGNOSIS — W1789XA Other fall from one level to another, initial encounter: Secondary | ICD-10-CM | POA: Diagnosis not present

## 2023-03-01 DIAGNOSIS — M7652 Patellar tendinitis, left knee: Secondary | ICD-10-CM | POA: Diagnosis not present

## 2023-03-01 DIAGNOSIS — N189 Chronic kidney disease, unspecified: Secondary | ICD-10-CM | POA: Diagnosis not present

## 2023-03-01 DIAGNOSIS — W19XXXD Unspecified fall, subsequent encounter: Secondary | ICD-10-CM

## 2023-03-01 MED ORDER — OXYCODONE-ACETAMINOPHEN 5-325 MG PO TABS: 1.0000 | ORAL_TABLET | Freq: Four times a day (QID) | ORAL | 0 refills | Status: DC | PRN

## 2023-03-01 MED ORDER — OXYCODONE-ACETAMINOPHEN 5-325 MG PO TABS
1.0000 | ORAL_TABLET | Freq: Once | ORAL | Status: AC
  Administered 2023-03-01: 1 via ORAL
  Filled 2023-03-01: qty 1

## 2023-03-01 NOTE — ED Provider Notes (Signed)
[place] EMERGENCY DEPARTMENT AT [facility] Provider Note   CSN: [id number] Arrival date & time: 03/01/23  1455     History Chief Complaint  Patient presents with   Knee Pain    [name]. is a 57 y.o. male.  Patient with history of hypertension, CKD, and DJD presents emergency department following a fall.  Endorsing left knee pain after initially being seen in the emergency department a few days ago for left ankle and foot pain.  States he is having worsening swelling and worsening mobility in the left knee.  Not currently on blood thinners.  The history is provided by the patient. No language interpreter was used.  Knee Pain Location:  Knee Time since incident:  2 days Injury: yes   Mechanism of injury: fall   Fall:    Fall occurred:  From a roof   Height of fall:  5-41ft   Impact surface:  Designer, fashion/clothing of impact:  Unable to specify   Entrapped after fall: no   Knee location:  L knee Pain details:    Quality:  Aching and pressure   Radiates to:  Does not radiate   Severity:  Moderate   Onset quality:  Gradual   Duration:  2 days   Timing:  Constant   Progression:  Worsening Dislocation: no   Foreign body present:  No foreign bodies      Home Medications Prior to Admission medications   Medication Sig Start Date End Date Taking? Authorizing Provider  albuterol (VENTOLIN HFA) 108 (90 Base) MCG/ACT inhaler INHALE 2 PUFFS BY MOUTH EVERY 6 HOURS AS NEEDED FOR WHEEZING OR SHORTNESS OF BREATH 11/20/22   Karie Georges, MD  amLODipine (NORVASC) 10 MG tablet Take 1 tablet (10 mg total) by mouth daily. 12/11/22   Karie Georges, MD  famotidine (PEPCID) 20 MG tablet Take 1 tablet (20 mg total) by mouth 2 (two) times daily as needed for heartburn or indigestion. 12/11/22   Karie Georges, MD  folic acid (FOLVITE) 1 MG tablet Take 1 tablet (1 mg total) by mouth daily. 12/11/22   Karie Georges, MD  ibuprofen (ADVIL) 600 MG tablet Take 1 tablet  (600 mg total) by mouth every 6 (six) hours as needed. 12/22/21   Fayrene Helper, PA-C  methocarbamol (ROBAXIN) 500 MG tablet Take 1 tablet (500 mg total) by mouth 2 (two) times daily. 02/26/23   Smoot, Shawn Route, PA-C  oxyCODONE-acetaminophen (PERCOCET/ROXICET) 5-325 MG tablet Take 1 tablet by mouth every 6 (six) hours as needed for severe pain. 03/01/23  Yes Smitty Knudsen, PA-C  sildenafil (VIAGRA) 100 MG tablet Take 0.5 tablets (50 mg total) by mouth daily as needed for erectile dysfunction. 12/11/22   Karie Georges, MD      Allergies    Patient has no known allergies.    Review of Systems   Review of Systems  Musculoskeletal:  Positive for joint swelling.  All other systems reviewed and are negative.   Physical Exam Updated Vital Signs BP (!) 152/113   Pulse 87   Temp 98.1 F (36.7 C) (Oral)   Resp 18   Ht 6\' 2"  (1.88 m)   Wt 102 kg   SpO2 95%   BMI 28.87 kg/m  Physical Exam Vitals and nursing note reviewed.  Constitutional:      General: He is not in acute distress.    Appearance: He is well-developed.  HENT:  Head: Normocephalic and atraumatic.  Eyes:     Conjunctiva/sclera: Conjunctivae normal.  Cardiovascular:     Rate and Rhythm: Normal rate and regular rhythm.     Heart sounds: No murmur heard. Pulmonary:     Effort: Pulmonary effort is normal. No respiratory distress.     Breath sounds: Normal breath sounds.  Abdominal:     Palpations: Abdomen is soft.     Tenderness: There is no abdominal tenderness.  Musculoskeletal:        General: Swelling and tenderness present. No deformity.     Cervical back: Neck supple.     Comments: Limited range of motion of the left knee due to pain.  Some swelling noted but no obvious signs of infection such as erythema, or warmth.  Skin:    General: Skin is warm and dry.     Capillary Refill: Capillary refill takes less than 2 seconds.  Neurological:     General: No focal deficit present.     Mental Status: He is alert.  Mental status is at baseline.     Motor: No weakness.  Psychiatric:        Mood and Affect: Mood normal.     ED Results / Procedures / Treatments   Labs (all labs ordered are listed, but only abnormal results are displayed) Labs Reviewed - No data to display  EKG None  Radiology DG Knee Complete 4 Views Left  Result Date: 03/01/2023 CLINICAL DATA:  Patient fell off a roof on Saturday. EXAM: LEFT KNEE - COMPLETE 4 VIEW COMPARISON:  None Available. FINDINGS: No evidence of fracture, dislocation, or joint effusion. Joint spaces are well-maintained. Small superior patellar enthesophyte. The soft tissues are unremarkable. IMPRESSION: Negative. Electronically Signed   By: Jacob Moores M.D.   On: 03/01/2023 16:24    Procedures Procedures   Medications Ordered in ED Medications  oxyCODONE-acetaminophen (PERCOCET/ROXICET) 5-325 MG per tablet 1 tablet (1 tablet Oral Given 03/01/23 1601)    ED Course/ Medical Decision Making/ A&P                           Medical Decision Making Amount and/or Complexity of Data Reviewed Radiology: ordered.   This patient presents to the ED for concern of fall.  Differential diagnosis includes ankle fracture, ankle dislocation, foot fracture, knee dislocation, patellar dislocation, ligamental injury of the knee  Imaging Studies ordered:  I ordered imaging studies including left knee x-ray I independently visualized and interpreted imaging which showed no evidence of any fractures, dislocation, joint effusion.  There is evidence of an osteophyte formation on the superior aspect of the patella but no other obvious deformity. I agree with the radiologist interpretation   Medicines ordered and prescription drug management:  I ordered medication including Percocet for pain Reevaluation of the patient after these medicines showed that the patient improved I have reviewed the patients home medicines and have made adjustments as needed   Problem List  / ED Course:  Patient presented to the emergency department complaints of left knee pain.  Reports he noted mechanical fall few days ago while he was on a ladder and his left ankle got caught.  States that at that time, he is able to ambulate without significant pain or pain in the left knee.  Denies any reinjury of the knee but states the swelling is gradually worsening with pain worsening at that time as well.  Not currently on blood thinners.  No  prior history of any knee replacements or other orthopedic surgeries in the left knee.  On examination, there is some mild swelling but no evidence of infection such as erythema or warmth of the left knee.  Low concern for septic joint at this time.  Will evaluate with x-ray imaging. X-ray imaging negative for any acute fractures or dislocations.  No obvious signs of any joint effusion either.  Low concern at this time for an infected knee or cellulitis over this area and this is likely an orthopedic injury.  There is a potential for an occult fracture that may not have been visualized on x-ray imaging if patient does not have improving symptoms and require further imaging.  Encourage patient to once again follow-up with orthopedics for further evaluation given left ankle/foot pain that he was seen for a few days ago as well.  Patient is agreeable with treatment plan and verbalized understanding all return precautions.  Patient reports Percocet quantity 10 was sent to patient's pharmacy for more severe pain he reports he is having difficulty managing pain with Aleve and Advil.  All questions answered prior to patient discharge.  Patient discharged home in good condition.  Final Clinical Impression(s) / ED Diagnoses Final diagnoses:  Acute pain of left knee    Rx / DC Orders ED Discharge Orders          Ordered    oxyCODONE-acetaminophen (PERCOCET/ROXICET) 5-325 MG tablet  Every 6 hours PRN        03/01/23 1707              Smitty Knudsen,  PA-C 03/01/23 1709    Linwood Dibbles, MD 03/01/23 [phone]

## 2023-03-01 NOTE — Discharge Instructions (Signed)
You are seen in the emergency department for left knee pain.  Your x-ray imaging was negative for any signs of any fractures, dislocations, joint effusions.  I would recommend following up with orthopedics as previously advised for further evaluation as there is always a potential for an occult fracture or a ligamental injury.  If you have any acute worsening of your symptoms, please return to the emergency department.  I have sent in a prescription for Percocet he may take as needed for more severe pain every 6-8 hours.  For moderate pain, you should take Advil or Aleve.

## 2023-03-01 NOTE — ED Triage Notes (Signed)
Patient fell off a roof on Saturday. Had scans done on Sunday on his left ankle. Monday began having left sided knee swelling and unable to bend knee.

## 2023-06-12 ENCOUNTER — Ambulatory Visit: Payer: BC Managed Care – PPO | Admitting: Family Medicine

## 2023-06-12 ENCOUNTER — Encounter: Payer: Self-pay | Admitting: Family Medicine

## 2023-06-12 VITALS — BP 136/80 | HR 85 | Temp 98.8°F | Ht 74.0 in | Wt 231.1 lb

## 2023-06-12 DIAGNOSIS — I10 Essential (primary) hypertension: Secondary | ICD-10-CM | POA: Diagnosis not present

## 2023-06-12 DIAGNOSIS — M65332 Trigger finger, left middle finger: Secondary | ICD-10-CM | POA: Diagnosis not present

## 2023-06-12 DIAGNOSIS — N529 Male erectile dysfunction, unspecified: Secondary | ICD-10-CM | POA: Diagnosis not present

## 2023-06-12 MED ORDER — VIAGRA 100 MG PO TABS
100.0000 mg | ORAL_TABLET | Freq: Every day | ORAL | 5 refills | Status: DC | PRN
Start: 2023-06-12 — End: 2023-06-15

## 2023-06-12 MED ORDER — AMLODIPINE BESYLATE 10 MG PO TABS: 10.0000 mg | ORAL_TABLET | Freq: Every day | ORAL | 1 refills | Status: DC

## 2023-06-12 NOTE — Patient Instructions (Signed)
Take ibuprofen at night to help with morning stiffness

## 2023-06-12 NOTE — Assessment & Plan Note (Signed)
Chronic, stable. BP controlled today on amlodipine 10 mg daily, will continue this medication

## 2023-06-12 NOTE — Assessment & Plan Note (Signed)
Chronic, stable, well controlled with 100 mg viagra daily as needed, will send in refills.

## 2023-06-12 NOTE — Progress Notes (Signed)
Established Patient Office Visit  Subjective   Patient ID: Erik Amlin., male    DOB: 07/22/1966  Age: 57 y.o. MRN: [id number]  Chief Complaint  Patient presents with   Medical Management of Chronic Issues   Medication Refill    Patient requests Rx for brand name Sidenafil-states he will pay cash    Pt is here for 6 month follow up today, he reports he has an appt with the GI doctor for his colonsocopy.   HTN -- BP in office performed and is well controlled. He  reports no side effects to the medications, no chest pain, SOB, dizziness or headaches. He has a BP cuff at home and is checking BP regularly, reports they are in the normal range.   ED-- pt is requesting a refill of the viagra, states that he is having to take a whole tablet but that it is working well for him, no side effects reported.   Pt was in the ER in July for a fall. He states that he "stoved" his left middle finger and ever since then it hasn't  "worked right." States that if he grips something the finger will often get stuck and he has to pry it back open. States that it is painful especially when his hands get cold. We discussed further steps and he is agreeable to a referral to the hand surgeon.        Current Outpatient Medications  Medication Instructions   albuterol (VENTOLIN HFA) 108 (90 Base) MCG/ACT inhaler INHALE 2 PUFFS BY MOUTH EVERY 6 HOURS AS NEEDED FOR WHEEZING OR SHORTNESS OF BREATH   amLODipine (NORVASC) 10 mg, Oral, Daily   famotidine (PEPCID) 20 mg, Oral, 2 times daily PRN   folic acid (FOLVITE) 1 mg, Oral, Daily   ibuprofen (ADVIL) 600 mg, Oral, Every 6 hours PRN   methocarbamol (ROBAXIN) 500 mg, Oral, 2 times daily   Viagra 100 mg, Oral, Daily PRN    Patient Active Problem List   Diagnosis Date Noted   Paresthesia and pain of both upper extremities 10/24/2022   History of pulmonary embolus (PE) 10/23/2022   Wheezing 10/23/2022   Low vision left eye category 1, normal vision right  eye 06/03/2019   DJD (degenerative joint disease) 06/03/2019   Healthcare maintenance 10/21/2014   Chronic kidney disease 11/28/2012   Erectile dysfunction 05/09/2012   Essential hypertension, benign 01/27/2009      Review of Systems  All other systems reviewed and are negative.     Objective:     BP 136/80 (BP Location: Left Arm, Patient Position: Sitting, Cuff Size: Large)   Pulse 85   Temp 98.8 F (37.1 C) (Oral)   Ht 6\' 2"  (1.88 m)   Wt 231 lb 1.6 oz (104.8 kg)   SpO2 95%   BMI 29.67 kg/m    Physical Exam Vitals reviewed.  Constitutional:      Appearance: Normal appearance. He is well-groomed and normal weight.  Eyes:     Extraocular Movements: Extraocular movements intact.     Conjunctiva/sclera: Conjunctivae normal.  Neck:     Thyroid: No thyromegaly.  Cardiovascular:     Rate and Rhythm: Normal rate and regular rhythm.     Heart sounds: S1 normal and S2 normal. No murmur heard. Pulmonary:     Effort: Pulmonary effort is normal.     Breath sounds: Normal breath sounds and air entry. No rales.  Musculoskeletal:     Right lower leg: No edema.  Left lower leg: No edema.  Neurological:     General: No focal deficit present.     Mental Status: He is alert and oriented to person, place, and time.     Gait: Gait is intact.  Psychiatric:        Mood and Affect: Mood and affect normal.      No results found for any visits on 06/12/23.    The 10-year ASCVD risk score (Arnett DK, et al., 2019) is: 11%    Assessment & Plan:  Essential hypertension, benign Assessment & Plan: Chronic, stable. BP controlled today on amlodipine 10 mg daily, will continue this medication  Orders: -     amLODIPine Besylate; Take 1 tablet (10 mg total) by mouth daily.  Dispense: 90 tablet; Refill: 1  Erectile dysfunction, unspecified erectile dysfunction type Assessment & Plan: Chronic, stable, well controlled with 100 mg viagra daily as needed, will send in refills.    Orders: -     Viagra; Take 1 tablet (100 mg total) by mouth daily as needed for erectile dysfunction.  Dispense: 30 tablet; Refill: 5  Acquired trigger finger of left middle finger -     Ambulatory referral to Hand Surgery   I reviewed his x-rays of his hand from the ER, will send in to see the hand surgeon for further recommendations.   Return in about 6 months (around 12/11/2023) for annual physical exam-- please come fasting for blood work.    Karie Georges, MD

## 2023-06-14 ENCOUNTER — Telehealth: Payer: Self-pay | Admitting: Family Medicine

## 2023-06-14 DIAGNOSIS — N529 Male erectile dysfunction, unspecified: Secondary | ICD-10-CM

## 2023-06-14 NOTE — Telephone Encounter (Signed)
Pt is calling and need generic viagra  [facility] [address], [place] - [address] Phone: [phone]  Fax: [phone]

## 2023-06-15 MED ORDER — SILDENAFIL CITRATE 100 MG PO TABS
100.0000 mg | ORAL_TABLET | Freq: Every day | ORAL | 5 refills | Status: DC | PRN
Start: 2023-06-15 — End: 2024-02-18

## 2023-06-15 NOTE — Telephone Encounter (Signed)
Rx sent for generic.

## 2023-08-20 ENCOUNTER — Other Ambulatory Visit: Payer: Self-pay | Admitting: Family Medicine

## 2023-08-20 NOTE — Telephone Encounter (Signed)
Copied from CRM [id number]. Topic: Clinical - Medication Refill >> Aug 20, 2023  4:47 PM Sonny Dandy B wrote: Most Recent Primary Care Visit:  Provider: Karie Georges  Department: [facility]  Visit Type: OFFICE VISIT  Date: 06/12/2023  Medication: HYDROcodone-acetaminophen (NORCO/VICODIN) 5-325 MG table  Has the patient contacted their pharmacy? No (Agent: If no, request that the patient contact the pharmacy for the refill. If patient does not wish to contact the pharmacy document the reason why and proceed with request.) (Agent: If yes, when and what did the pharmacy advise?)  Is this the correct pharmacy for this prescription? Yes If no, delete pharmacy and type the correct one.  This is the patient's preferred pharmacy:  [facility] [address] [place] - [address] [place] [address] Phone: [phone] Fax: [phone]   Has the prescription been filled recently? Yes  Is the patient out of the medication? Yes  Has the patient been seen for an appointment in the last year OR does the patient have an upcoming appointment? Yes  Can we respond through MyChart? No  Agent: Please be advised that Rx refills may take up to 3 business days. We ask that you follow-up with your pharmacy.

## 2023-09-03 ENCOUNTER — Ambulatory Visit: Payer: Self-pay | Admitting: Family Medicine

## 2023-09-10 ENCOUNTER — Ambulatory Visit (INDEPENDENT_AMBULATORY_CARE_PROVIDER_SITE_OTHER): Payer: Managed Care, Other (non HMO) | Admitting: Family Medicine

## 2023-09-10 ENCOUNTER — Encounter: Payer: Self-pay | Admitting: Family Medicine

## 2023-09-10 VITALS — BP 126/80 | HR 60 | Temp 98.5°F | Resp 16 | Ht 74.0 in | Wt 230.0 lb

## 2023-09-10 DIAGNOSIS — R21 Rash and other nonspecific skin eruption: Secondary | ICD-10-CM

## 2023-09-10 DIAGNOSIS — Z125 Encounter for screening for malignant neoplasm of prostate: Secondary | ICD-10-CM

## 2023-09-10 DIAGNOSIS — D649 Anemia, unspecified: Secondary | ICD-10-CM | POA: Diagnosis not present

## 2023-09-10 LAB — COMPREHENSIVE METABOLIC PANEL
ALT: 13 U/L (ref 0–53)
AST: 15 U/L (ref 0–37)
Albumin: 4.1 g/dL (ref 3.5–5.2)
Alkaline Phosphatase: 98 U/L (ref 39–117)
BUN: 13 mg/dL (ref 6–23)
CO2: 27 meq/L (ref 19–32)
Calcium: 9.1 mg/dL (ref 8.4–10.5)
Chloride: 103 meq/L (ref 96–112)
Creatinine, Ser: 1.21 mg/dL (ref 0.40–1.50)
GFR: 67.28 mL/min (ref >60.00)
Glucose, Bld: 89 mg/dL (ref 70–99)
Potassium: 3.9 meq/L (ref 3.5–5.1)
Sodium: 139 meq/L (ref 135–145)
Total Bilirubin: 1.1 mg/dL (ref 0.2–1.2)
Total Protein: 7.6 g/dL (ref 6.0–8.3)

## 2023-09-10 LAB — C-REACTIVE PROTEIN: CRP: 1.2 mg/dL (ref 0.5–20.0)

## 2023-09-10 LAB — CBC WITH DIFFERENTIAL/PLATELET
Basophils Absolute: 0 10*3/uL (ref 0.0–0.1)
Basophils Relative: 0.7 % (ref 0.0–3.0)
Eosinophils Absolute: 0.1 10*3/uL (ref 0.0–0.7)
Eosinophils Relative: 3.3 % (ref 0.0–5.0)
HCT: 36.4 % — ABNORMAL LOW (ref 39.0–52.0)
Hemoglobin: 11.7 g/dL — ABNORMAL LOW (ref 13.0–17.0)
Lymphocytes Relative: 15.3 % (ref 12.0–46.0)
Lymphs Abs: 0.7 10*3/uL (ref 0.7–4.0)
MCHC: 32.3 g/dL (ref 30.0–36.0)
MCV: 82.8 fL (ref 78.0–100.0)
Monocytes Absolute: 0.3 10*3/uL (ref 0.1–1.0)
Monocytes Relative: 7.7 % (ref 3.0–12.0)
Neutro Abs: 3.3 10*3/uL (ref 1.4–7.7)
Neutrophils Relative %: 73 % (ref 43.0–77.0)
Platelets: 308 10*3/uL (ref 150.0–400.0)
RBC: 4.39 Mil/uL (ref 4.22–5.81)
RDW: 14.3 % (ref 11.5–15.5)
WBC: 4.4 10*3/uL (ref 4.0–10.5)

## 2023-09-10 LAB — PSA: PSA: 3.2 ng/mL (ref 0.10–4.00)

## 2023-09-10 NOTE — Patient Instructions (Addendum)
 A few things to remember from today's visit:  Rash and nonspecific skin eruption - Plan: Comprehensive metabolic panel, CBC with Differential/Platelet, C-reactive protein, RPR, HIV Antibody (routine testing w rflx), Ambulatory referral to Dermatology  Iron deficiency anemia, unspecified iron deficiency anemia type - Plan: Comprehensive metabolic panel, CBC with Differential/Platelet, C-reactive protein, Ambulatory referral to Gastroenterology  Monitor for new symptoms. Appt with dermatologist will be arranged.  Do not use My Chart to request refills or for acute issues that need immediate attention. If you send a my chart message, it may take a few days to be addressed, specially if I am not in the office.  Please be sure medication list is accurate. If a new problem present, please set up appointment sooner than planned today.

## 2023-09-10 NOTE — Progress Notes (Signed)
 ACUTE VISIT Chief Complaint  Patient presents with   Spots    On arms & legs, started about 2 weeks ago   HPI: [name] L Crudup Jr. is a 58 y.o. male with a PMHx significant for HTN, DJD, CKD, ED, intermittent wheezing, and paresthesia of both upper extremities, who is here today complaining of non pruritic rash. Patient complains of spots on his arms and legs for several months that has been worsening since about 2 weeks ago. Problem is constant. Rash has not been erythematous or tender at any time, appearence has not changed but more lesions appearing.  Rash This is a recurrent problem. The current episode started more than 1 month ago. The problem has been gradually worsening since onset. He was exposed to nothing. Pertinent negatives include no anorexia, congestion, cough, diarrhea, eye pain, facial edema, fatigue, joint pain, nail changes, rhinorrhea, shortness of breath, sore throat or vomiting. Past treatments include nothing. There is no history of allergies, asthma or eczema.   Rash does not affect palms or soles. He has not identified exacerbating or alleviating factors.  He denies any new medication, detergent, soap, or body product. No known insect bite or outdoor exposures to plants. No sick contact. No Hx of eczema or similar rash in the past.  He has not taken any OTC medications.   Pertinent negatives include itching, pruritus, muscle aches, fever, chills, mouth lesions, abnormal weight loss, night sweats, or cold like symptoms.  No hx of diabetes.  Lab Results  Component Value Date   NA 138 11/01/2022   CL 103 11/01/2022   K 4.0 11/01/2022   CO2 26 11/01/2022   BUN 14 11/01/2022   CREATININE 0.99 11/01/2022   GFR 86.12 11/01/2022   CALCIUM 9.5 11/01/2022   ALBUMIN 3.8 11/01/2022   GLUCOSE 81 11/01/2022   Normocytic anemia: He has not had colonoscopy done. Negative for blood in the stool, melena, or gross hematuria.     Latest Ref Rng & Units  01/17/2023    2:44 PM 11/01/2022    8:36 AM 06/03/2019    1:52 PM  CBC  WBC 4.0 - 10.5 K/uL 4.5  4.3  5.0   Hemoglobin 13.0 - 17.0 g/dL 88.1  87.8  87.3   Hematocrit 39.0 - 52.0 % 36.6  36.1  37.3   Platelets 150.0 - 400.0 K/uL 279.0  249.0  281    Review of Systems  Constitutional:  Negative for activity change, appetite change, chills and fatigue.  HENT:  Negative for congestion, rhinorrhea, sore throat and trouble swallowing.   Eyes:  Negative for pain and discharge.  Respiratory:  Negative for cough and shortness of breath.   Cardiovascular:  Negative for chest pain, palpitations and leg swelling.  Gastrointestinal:  Negative for abdominal pain, anorexia, diarrhea and vomiting.  Endocrine: Negative for cold intolerance and heat intolerance.  Genitourinary:  Negative for decreased urine volume and dysuria.  Musculoskeletal:  Negative for arthralgias, joint pain and joint swelling.  Skin:  Positive for rash. Negative for nail changes.  Neurological:  Negative for syncope, facial asymmetry, weakness and headaches.  See other pertinent positives and negatives in HPI.  Current Outpatient Medications on File Prior to Visit  Medication Sig Dispense Refill   albuterol  (VENTOLIN  HFA) 108 (90 Base) MCG/ACT inhaler INHALE 2 PUFFS BY MOUTH EVERY 6 HOURS AS NEEDED FOR WHEEZING OR SHORTNESS OF BREATH 9 g 0   amLODipine  (NORVASC ) 10 MG tablet Take 1 tablet (10 mg total) by mouth  daily. 90 tablet 1   famotidine  (PEPCID ) 20 MG tablet Take 1 tablet (20 mg total) by mouth 2 (two) times daily as needed for heartburn or indigestion. 180 tablet 0   folic acid  (FOLVITE ) 1 MG tablet Take 1 tablet (1 mg total) by mouth daily. 90 tablet 3   ibuprofen  (ADVIL ) 600 MG tablet Take 1 tablet (600 mg total) by mouth every 6 (six) hours as needed. 30 tablet 0   methocarbamol  (ROBAXIN ) 500 MG tablet Take 1 tablet (500 mg total) by mouth 2 (two) times daily. 20 tablet 0   sildenafil  (VIAGRA ) 100 MG tablet Take 1 tablet  (100 mg total) by mouth daily as needed for erectile dysfunction. 30 tablet 5   No current facility-administered medications on file prior to visit.    Past Medical History:  Diagnosis Date   Hypertension    No Known Allergies  Social History   Socioeconomic History   Marital status: Divorced    Spouse name: Not on file   Number of children: Not on file   Years of education: Not on file   Highest education level: Not on file  Occupational History   Not on file  Tobacco Use   Smoking status: Never   Smokeless tobacco: Never  Vaping Use   Vaping status: Never Used  Substance and Sexual Activity   Alcohol  use: Yes    Comment: socially   Drug use: Never   Sexual activity: Yes  Other Topics Concern   Not on file  Social History Narrative   Lives Alone    Works as journalist, newspaper   Social Drivers of Health   Financial Resource Strain: Not on file  Food Insecurity: No Food Insecurity (08/27/2019)   Received from [facility], [facility]   Hunger Vital Sign    Worried About Running Out of Food in the Last Year: Never true    Ran Out of Food in the Last Year: Never true  Transportation Needs: No Transportation Needs (08/27/2019)   Received from [facility], [facility]   [facility] - Transportation    Lack of Transportation (Medical): No    Lack of Transportation (Non-Medical): No  Physical Activity: Not on file  Stress: Not on file  Social Connections: Not on file    Vitals:   09/10/23 0924  BP: 126/80  Pulse: 60  Resp: 16  Temp: 98.5 F (36.9 C)  SpO2: 98%   Body mass index is 29.53 kg/m.  Physical Exam Vitals and nursing note reviewed.  Constitutional:      General: He is not in acute distress.    Appearance: He is well-developed.  HENT:     Head: Normocephalic and atraumatic.     Mouth/Throat:     Mouth: Mucous membranes are moist.     Pharynx: Oropharynx is clear. Uvula midline.  Eyes:     Conjunctiva/sclera: Conjunctivae normal.   Neck:   Cardiovascular:     Rate and Rhythm: Normal rate and regular rhythm.     Heart sounds: No murmur heard. Pulmonary:     Effort: Pulmonary effort is normal. No respiratory distress.     Breath sounds: Normal breath sounds.  Musculoskeletal:        General: No swelling or tenderness.  Lymphadenopathy:     Cervical: No cervical adenopathy.     Upper Body:     Right upper body: No supraclavicular adenopathy.     Left upper body: No supraclavicular adenopathy.  Skin:    General: Skin is warm.     Findings: No erythema.     Comments: Scattered hyperpigmented macular lesions affecting upper and lower extremities, confluent. Not tender or scaly. See pictures.  Neurological:     Mental Status: He is alert and oriented to person, place, and time.     Cranial Nerves: No cranial nerve deficit.     Gait: Gait normal.  Psychiatric:        Mood and Affect: Mood and affect normal.          ASSESSMENT AND PLAN:  [name] Tucker was seen today for rash.   Lab Results  Component Value Date   NA 139 09/10/2023   CL 103 09/10/2023   K 3.9 09/10/2023   CO2 27 09/10/2023   BUN 13 09/10/2023   CREATININE 1.21 09/10/2023   GFR 67.28 09/10/2023   CALCIUM 9.1 09/10/2023   ALBUMIN 4.1 09/10/2023   GLUCOSE 89 09/10/2023   Lab Results  Component Value Date   ALT 13 09/10/2023   AST 15 09/10/2023   ALKPHOS 98 09/10/2023   BILITOT 1.1 09/10/2023   Lab Results  Component Value Date   WBC 4.4 09/10/2023   HGB 11.7 (L) 09/10/2023   HCT 36.4 (L) 09/10/2023   MCV 82.8 09/10/2023   PLT 308.0 09/10/2023   Lab Results  Component Value Date   CRP 1.2 09/10/2023   Rash and nonspecific skin eruption Problem seems to be chronic, he has been going on for a few months but he reports that it is getting worse. I am not sure about etiology, we discussed possible causes. Recommend applying selsun of blue from neck down and rinse after 10 minutes to treat possible fungal infection. Pictures  were taking and added to note after verbal consent, underwear on. Dermatology referral placed. Further recommendation will be given according to lab results.  -     Comprehensive metabolic panel; Future -     CBC with Differential/Platelet; Future -     C-reactive protein; Future -     RPR; Future -     HIV Antibody (routine testing w rflx); Future -     Ambulatory referral to Dermatology  Normocytic anemia  He has not had a colonoscopy, may also need an EGD. GI referral placed.  -     Comprehensive metabolic panel; Future -     CBC with Differential/Platelet; Future -     C-reactive protein; Future -     Ambulatory referral to Gastroenterology  Return if symptoms worsen or fail to improve.  I, Leonce PARAS Wierda, acting as a scribe for Williemae Muriel, MD., have documented all relevant documentation on the behalf of [name] Anastos, MD, as directed by  Safina Huard, MD while in the presence of Elese Rane, MD.   I, Sahas Sluka, MD, have reviewed all documentation for this visit. The documentation on 09/10/23 for the exam, diagnosis, procedures, and orders are all accurate and complete.  Keean Wilmeth G. Virjean Boman, MD  [facility]. [facility] office.

## 2023-09-11 LAB — HIV ANTIBODY (ROUTINE TESTING W REFLEX): HIV 1&2 Ab, 4th Generation: NONREACTIVE

## 2023-09-11 LAB — RPR: RPR Ser Ql: NONREACTIVE

## 2023-09-13 ENCOUNTER — Telehealth: Payer: Self-pay | Admitting: Family Medicine

## 2023-09-13 NOTE — Telephone Encounter (Signed)
Called pt and left vm for pt to call back with name of insurance company. Will need the name in order to add his insurance information to his chart.

## 2023-10-09 ENCOUNTER — Telehealth: Payer: Self-pay | Admitting: Family Medicine

## 2023-10-09 NOTE — Telephone Encounter (Signed)
Would like to discuss lab results, please contact pt at [phone].

## 2023-10-09 NOTE — Telephone Encounter (Signed)
Spoke with the patient and informed him of the lab results from 09/10/23 by Dr Casimiro Needle.  Patient questioned if she said anything about the pictures of his skin that she took during the visit.  I reviewed the office notes from Dr Elvis Coil visit on 09/10/23 and informed him a referral was placed to dermatology for evaluation.  Patient was advised our referral coordinator will work on processing this through insurance, it can take some time to get in with the dermatologist and he will be contacted with appt info.

## 2023-12-10 ENCOUNTER — Encounter: Payer: BC Managed Care – PPO | Admitting: Family Medicine

## 2023-12-17 ENCOUNTER — Telehealth: Payer: Self-pay

## 2023-12-17 NOTE — Telephone Encounter (Signed)
 Copied from CRM [id number]. Topic: Clinical - Medication Question >> Dec 13, 2023  8:47 AM Bambi Bonine D wrote: Reason for CRM: Patient stated that his gout has started to flare up again and he needs some medication prescribed. Patient stated that his big toe is swollen and is unable to walk as normal. Patient stated that he is not currently taking the medication and would like if the medication could be sent up to the pharmacy today is possible.

## 2023-12-17 NOTE — Telephone Encounter (Signed)
 Patient was scheduled for an appt with Kingsley Penny on 4/22.

## 2023-12-18 ENCOUNTER — Ambulatory Visit (INDEPENDENT_AMBULATORY_CARE_PROVIDER_SITE_OTHER): Admitting: Family Medicine

## 2023-12-18 ENCOUNTER — Encounter: Payer: Self-pay | Admitting: Family Medicine

## 2023-12-18 VITALS — BP 124/86 | HR 60 | Temp 98.2°F | Wt 225.0 lb

## 2023-12-18 DIAGNOSIS — M10071 Idiopathic gout, right ankle and foot: Secondary | ICD-10-CM

## 2023-12-18 DIAGNOSIS — R221 Localized swelling, mass and lump, neck: Secondary | ICD-10-CM

## 2023-12-18 MED ORDER — COLCHICINE 0.6 MG PO CAPS
ORAL_CAPSULE | ORAL | 2 refills | Status: DC
Start: 2023-12-18 — End: 2024-04-10

## 2023-12-18 NOTE — Patient Instructions (Signed)
-  Pain in right big toe last week was more likely gout. Provided information about gout and diet to help with preventing gout. -Prescribed Colchicine  to take if you have another flare up. If you continue to have flare ups, may need to place on a gout preventive medication.  -Ordered soft tissue ultrasound for mass on neck. You should be contacted either by phone or MyChart within 2 weeks to schedule appointment.  -Follow up with PCP or this provider if not improved.

## 2023-12-18 NOTE — Progress Notes (Signed)
   Acute Office Visit   Subjective:  Patient ID: [name]., male    DOB: March 19, 1966, 58 y.o.   MRN: [id number]  Chief Complaint  Patient presents with   Foot Pain   Gout    HPI:  Patient is complaining of big right toe pain last week. He reports he has a history of gout. Last attack was 7 months ago. He reports his big toe was painful, red, and swollen. Unable to bend toe last week. He reports he drunk black berry juice and medication that his spouse had for pain. It was a gel.   Patient reports he has mass on his neck that has been present for 1-2 years.  ROS See HPI above      Objective:   BP 124/86   Pulse 60   Temp 98.2 F (36.8 C) (Oral)   Wt 225 lb (102.1 kg)   SpO2 98%   BMI 28.89 kg/m    Physical Exam Vitals reviewed.  Constitutional:      General: He is not in acute distress.    Appearance: Normal appearance. He is not ill-appearing, toxic-appearing or diaphoretic.  Eyes:     General:        Right eye: No discharge.        Left eye: No discharge.     Conjunctiva/sclera: Conjunctivae normal.  Cardiovascular:     Rate and Rhythm: Normal rate and regular rhythm.     Heart sounds: Normal heart sounds. No murmur heard.    No friction rub. No gallop.  Pulmonary:     Effort: Pulmonary effort is normal. No respiratory distress.     Breath sounds: Normal breath sounds.  Musculoskeletal:        General: Normal range of motion.     Comments: Right big toe: No redness, swelling, and able to move toe  Skin:    General: Skin is warm and dry.     Findings: Lesion (Palpable moveable mass on the right side of neck.) present.  Neurological:     General: No focal deficit present.     Mental Status: He is alert and oriented to person, place, and time. Mental status is at baseline.  Psychiatric:        Mood and Affect: Mood normal.        Behavior: Behavior normal.        Thought Content: Thought content normal.        Judgment: Judgment normal.        Assessment & Plan:  Acute idiopathic gout involving toe of right foot -     Colchicine ; Take 2 tablets by mouth once daily, then take another tablet 1 hour afterwards.  Dispense: 3 capsule; Refill: 2  Localized swelling, mass or lump of neck -     US  SOFT TISSUE HEAD & NECK (NON-THYROID); Future  -Pain in right big toe last week was more likely gout. Provided information about gout and diet to help with preventing gout. -Prescribed Colchicine  to take if he has another flare up. Advised if he continues to have flare ups, may need to place on a gout preventive medication.  -Ordered soft tissue ultrasound for mass on neck. He should be contacted either by phone or MyChart within 2 weeks to schedule appointment.  -Follow up with PCP or this provider if not improved.  Dawanda Mapel, NP

## 2023-12-24 ENCOUNTER — Encounter: Payer: Self-pay | Admitting: Family Medicine

## 2024-02-18 ENCOUNTER — Encounter: Payer: Self-pay | Admitting: Family Medicine

## 2024-02-18 ENCOUNTER — Ambulatory Visit (INDEPENDENT_AMBULATORY_CARE_PROVIDER_SITE_OTHER): Admitting: Family Medicine

## 2024-02-18 VITALS — BP 118/72 | HR 66 | Temp 97.8°F | Ht 74.0 in | Wt 219.0 lb

## 2024-02-18 DIAGNOSIS — F39 Unspecified mood [affective] disorder: Secondary | ICD-10-CM | POA: Diagnosis not present

## 2024-02-18 DIAGNOSIS — Z13220 Encounter for screening for lipoid disorders: Secondary | ICD-10-CM

## 2024-02-18 DIAGNOSIS — Z1211 Encounter for screening for malignant neoplasm of colon: Secondary | ICD-10-CM | POA: Diagnosis not present

## 2024-02-18 DIAGNOSIS — I10 Essential (primary) hypertension: Secondary | ICD-10-CM

## 2024-02-18 DIAGNOSIS — N529 Male erectile dysfunction, unspecified: Secondary | ICD-10-CM

## 2024-02-18 LAB — LIPID PANEL
Cholesterol: 158 mg/dL (ref 0–200)
HDL: 53.8 mg/dL (ref >39.00)
LDL Cholesterol: 90 mg/dL (ref 0–99)
NonHDL: 104.4
Total CHOL/HDL Ratio: 3
Triglycerides: 74 mg/dL (ref 0.0–149.0)
VLDL: 14.8 mg/dL (ref 0.0–40.0)

## 2024-02-18 MED ORDER — AMLODIPINE BESYLATE 10 MG PO TABS: 10.0000 mg | ORAL_TABLET | Freq: Every day | ORAL | 1 refills | Status: DC

## 2024-02-18 MED ORDER — SILDENAFIL CITRATE 100 MG PO TABS
100.0000 mg | ORAL_TABLET | Freq: Every day | ORAL | 5 refills | Status: AC | PRN
Start: 2024-02-18 — End: ?

## 2024-02-18 NOTE — Progress Notes (Unsigned)
   Established Patient Office Visit  Subjective   Patient ID: [name]., male    DOB: Jun 27, 1966  Age: 58 y.o. MRN: [id number]  Chief Complaint  Patient presents with   Grief    Patient states he needs someone to talk to-vent and requests referral to psychiatrist due to loss of two brothers    Pt is here to discuss the need for mental health counseling or to talk to a doctor about how he is feeling. States that he doesn't really have anyone to vent to. States that his brothers and mom have passed and he just needs to talk to someone  Pt reports that the dark spots on his legs maybe have gotten worse, more numerous in number, they do not itch, no flaking or crusting, has tried selsun blue but this hasn't really gotten better. Does not use lotions or anything like that.     Current Outpatient Medications  Medication Instructions   albuterol  (VENTOLIN  HFA) 108 (90 Base) MCG/ACT inhaler INHALE 2 PUFFS BY MOUTH EVERY 6 HOURS AS NEEDED FOR WHEEZING OR SHORTNESS OF BREATH   amLODipine  (NORVASC ) 10 mg, Oral, Daily   Colchicine  0.6 MG CAPS Take 2 tablets by mouth once daily, then take another tablet 1 hour afterwards.   famotidine  (PEPCID ) 20 mg, Oral, 2 times daily PRN   folic acid  (FOLVITE ) 1 mg, Oral, Daily   ibuprofen  (ADVIL ) 600 mg, Oral, Every 6 hours PRN   methocarbamol  (ROBAXIN ) 500 mg, Oral, 2 times daily   sildenafil  (VIAGRA ) 100 mg, Oral, Daily PRN    {History (Optional):23778}  Review of Systems  All other systems reviewed and are negative.     Objective:     BP 118/72   Pulse 66   Temp 97.8 F (36.6 C) (Oral)   Ht 6' 2 (1.88 m)   Wt 219 lb (99.3 kg)   SpO2 99%   BMI 28.12 kg/m  {Vitals History (Optional):23777}  Physical Exam Vitals reviewed.  Constitutional:      Appearance: Normal appearance. He is normal weight.  Neck:     Comments: Right side of neck below the ear there is a 2 cm round, freely moveable mass that is nontender, fluid filled  consistency Neurological:     Mental Status: He is alert.      No results found for any visits on 02/18/24.  {Labs (Optional):23779}  The 10-year ASCVD risk score (Arnett DK, et al., 2019) is: 8.8%    Assessment & Plan:  Mood disorder (HCC) -     Ambulatory referral to Psychology  Lipid screening -     Lipid panel; Future  Colon cancer screening -     Cologuard  Essential hypertension, benign -     amLODIPine  Besylate; Take 1 tablet (10 mg total) by mouth daily.  Dispense: 90 tablet; Refill: 1  Erectile dysfunction, unspecified erectile dysfunction type -     Sildenafil  Citrate; Take 1 tablet (100 mg total) by mouth daily as needed for erectile dysfunction.  Dispense: 30 tablet; Refill: 5     Return in about 6 months (around 08/19/2024) for HTN, annual physical exam.    Heron CHRISTELLA Sharper, MD

## 2024-02-19 ENCOUNTER — Ambulatory Visit: Payer: Self-pay | Admitting: Family Medicine

## 2024-02-20 NOTE — Assessment & Plan Note (Signed)
Chronic, stable. BP controlled today on amlodipine 10 mg daily, will continue this medication

## 2024-02-21 ENCOUNTER — Ambulatory Visit (HOSPITAL_BASED_OUTPATIENT_CLINIC_OR_DEPARTMENT_OTHER)

## 2024-03-03 ENCOUNTER — Ambulatory Visit: Payer: Self-pay | Admitting: Family Medicine

## 2024-03-03 ENCOUNTER — Ambulatory Visit (HOSPITAL_BASED_OUTPATIENT_CLINIC_OR_DEPARTMENT_OTHER)
Admission: RE | Admit: 2024-03-03 | Discharge: 2024-03-03 | Disposition: A | Discharge status: Home / Self Care | Source: Ambulatory Visit | Attending: Family Medicine | Admitting: Family Medicine

## 2024-03-03 DIAGNOSIS — R221 Localized swelling, mass and lump, neck: Secondary | ICD-10-CM | POA: Insufficient documentation

## 2024-03-10 ENCOUNTER — Ambulatory Visit: Admitting: Licensed Clinical Social Worker

## 2024-03-20 LAB — COLOGUARD: COLOGUARD: NEGATIVE

## 2024-03-31 ENCOUNTER — Ambulatory Visit: Payer: Self-pay | Admitting: General Surgery

## 2024-04-01 ENCOUNTER — Telehealth: Payer: Self-pay | Admitting: *Deleted

## 2024-04-01 NOTE — Telephone Encounter (Signed)
 Copied from CRM #[id number]. Topic: Referral - Request for Referral >> Apr 01, 2024  9:45 AM Carlyon D wrote: Did the patient discuss referral with their provider in the last year? Yes (If No - schedule appointment) (If Yes - send message)  Appointment offered? Yes  Type of order/referral and detailed reason for visit: Dermatologist   Preference of office, provider, location: [facility]   If referral order, have you been seen by this specialty before? No (If Yes, this issue or another issue? When? Where?  Can we respond through MyChart? Yes

## 2024-04-01 NOTE — Telephone Encounter (Signed)
 Left a message for the patient to return my call.

## 2024-04-04 NOTE — Telephone Encounter (Signed)
 Spoke with the patient as a referral was placed to [facility] on 09/10/23 (by Dr [place]). Patient stated he called the office and was told they are no longer accepting new patients.  Message sent to PCP for new referral.

## 2024-04-04 NOTE — Telephone Encounter (Signed)
 Can we find someone else to send him to? Forward this to [place] so she can send it somewhere else?

## 2024-04-11 NOTE — Progress Notes (Signed)
 Surgical Instructions   Your procedure is scheduled on Wednesday April 17, 2024. Report to [facility] Main Entrance A at 5:30 A.M., then check in with the Admitting office. Any questions or running late day of surgery: call [phone]  Questions prior to your surgery date: call [phone], Monday-Friday, 8am-4pm. If you experience any cold or flu symptoms such as cough, fever, chills, shortness of breath, etc. between now and your scheduled surgery, please notify us  at the above number.     Remember:  Do not eat after midnight the night before your surgery   You may drink clear liquids until 4:30 the morning of your surgery.   Clear liquids allowed are: Water, Non-Citrus Juices (without pulp), Carbonated Beverages, Clear Tea (no milk, honey, etc.), Black Coffee Only (NO MILK, CREAM OR POWDERED CREAMER of any kind), and Gatorade.    Take these medicines the morning of surgery with A SIP OF WATER  amLODipine  (NORVASC )   May take these medicines IF NEEDED: colchicine     One week prior to surgery, STOP taking any Aspirin (unless otherwise instructed by your surgeon) Aleve , Naproxen , Ibuprofen , Motrin , Advil , Goody's, BC's, all herbal medications, fish oil, and non-prescription vitamins.                     Do NOT Smoke (Tobacco/Vaping) for 24 hours prior to your procedure.  If you use a CPAP at night, you may bring your mask/headgear for your overnight stay.   You will be asked to remove any contacts, glasses, piercing's, hearing aid's, dentures/partials prior to surgery. Please bring cases for these items if needed.    Patients discharged the day of surgery will not be allowed to drive home, and someone needs to stay with them for 24 hours.  SURGICAL WAITING ROOM VISITATION Patients may have no more than 2 support people in the waiting area - these visitors may rotate.   Pre-op nurse will coordinate an appropriate time for 1 ADULT support person, who may not rotate, to  accompany patient in pre-op.  Children under the age of 51 must have an adult with them who is not the patient and must remain in the main waiting area with an adult.  If the patient needs to stay at the hospital during part of their recovery, the visitor guidelines for inpatient rooms apply.  Please refer to the [facility] website for the visitor guidelines for any additional information.   If you received a COVID test during your pre-op visit  it is requested that you wear a mask when out in public, stay away from anyone that may not be feeling well and notify your surgeon if you develop symptoms. If you have been in contact with anyone that has tested positive in the last 10 days please notify you surgeon.      Pre-operative CHG Bathing Instructions   You can play a key role in reducing the risk of infection after surgery. Your skin needs to be as free of germs as possible. You can reduce the number of germs on your skin by washing with CHG (chlorhexidine  gluconate) soap before surgery. CHG is an antiseptic soap that kills germs and continues to kill germs even after washing.   DO NOT use if you have an allergy to chlorhexidine /CHG or antibacterial soaps. If your skin becomes reddened or irritated, stop using the CHG and notify one of our RNs at [phone].  TAKE A SHOWER THE NIGHT BEFORE SURGERY AND THE DAY OF SURGERY    Please keep in mind the following:  You may shave your face before/day of surgery.  Place clean sheets on your bed the night before surgery Use a clean washcloth (not used since being washed) for each shower. DO NOT sleep with pet's night before surgery.  CHG Shower Instructions:  Wash your face and private area with normal soap. If you choose to wash your hair, wash first with your normal shampoo.  After you use shampoo/soap, rinse your hair and body thoroughly to remove shampoo/soap residue.  Turn the water OFF and apply half the bottle of CHG soap  to a CLEAN washcloth.  Apply CHG soap ONLY FROM YOUR NECK DOWN TO YOUR TOES (washing for 3-5 minutes)  DO NOT use CHG soap on face, private areas, open wounds, or sores.  Pay special attention to the area where your surgery is being performed.  If you are having back surgery, having someone wash your back for you may be helpful. Wait 2 minutes after CHG soap is applied, then you may rinse off the CHG soap.  Pat dry with a clean towel  Put on clean pajamas    Additional instructions for the day of surgery: DO NOT APPLY any lotions, deodorants or cologne.   Do not wear jewelry  Do not bring valuables to the hospital. [facility] is not responsible for valuables/personal belongings. Put on clean/comfortable clothes.  Please brush your teeth.  Ask your nurse before applying any prescription medications to the skin.

## 2024-04-14 ENCOUNTER — Inpatient Hospital Stay (HOSPITAL_COMMUNITY)
Admission: RE | Admit: 2024-04-14 | Discharge: 2024-04-14 | Disposition: A | Discharge status: Home / Self Care | Source: Ambulatory Visit | Attending: General Surgery

## 2024-04-14 ENCOUNTER — Other Ambulatory Visit: Payer: Self-pay

## 2024-04-14 ENCOUNTER — Encounter (HOSPITAL_COMMUNITY): Payer: Self-pay

## 2024-04-14 VITALS — BP 138/96 | HR 58 | Temp 98.2°F | Resp 17 | Ht 74.0 in | Wt 227.5 lb

## 2024-04-14 DIAGNOSIS — Z01810 Encounter for preprocedural cardiovascular examination: Secondary | ICD-10-CM | POA: Diagnosis present

## 2024-04-14 DIAGNOSIS — R9431 Abnormal electrocardiogram [ECG] [EKG]: Secondary | ICD-10-CM | POA: Insufficient documentation

## 2024-04-14 DIAGNOSIS — Z01812 Encounter for preprocedural laboratory examination: Secondary | ICD-10-CM | POA: Diagnosis present

## 2024-04-14 DIAGNOSIS — Z01818 Encounter for other preprocedural examination: Secondary | ICD-10-CM | POA: Insufficient documentation

## 2024-04-14 LAB — CBC
HCT: 37.2 % — ABNORMAL LOW (ref 39.0–52.0)
Hemoglobin: 11.6 g/dL — ABNORMAL LOW (ref 13.0–17.0)
MCH: 26.4 pg (ref 26.0–34.0)
MCHC: 31.2 g/dL (ref 30.0–36.0)
MCV: 84.5 fL (ref 80.0–100.0)
Platelets: 266 K/uL (ref 150–400)
RBC: 4.4 MIL/uL (ref 4.22–5.81)
RDW: 14.2 % (ref 11.5–15.5)
WBC: 4.5 K/uL (ref 4.0–10.5)
nRBC: 0 % (ref 0.0–0.2)

## 2024-04-14 LAB — BASIC METABOLIC PANEL WITH GFR
Anion gap: 9 (ref 5–15)
BUN: 13 mg/dL (ref 6–20)
CO2: 28 mmol/L (ref 22–32)
Calcium: 9 mg/dL (ref 8.9–10.3)
Chloride: 103 mmol/L (ref 98–111)
Creatinine, Ser: 1.05 mg/dL (ref 0.61–1.24)
GFR, Estimated: >60 mL/min (ref >60)
Glucose, Bld: 95 mg/dL (ref 70–99)
Potassium: 4.1 mmol/L (ref 3.5–5.1)
Sodium: 140 mmol/L (ref 135–145)

## 2024-04-14 NOTE — Progress Notes (Signed)
 PCP - Dr. Heron Sharper Cardiologist -   PPM/ICD - denies Device Orders - na Rep Notified - na  Chest x-ray - na EKG - PAT, 04/14/2024 Stress Test -  ECHO -  Cardiac Cath -   Sleep Study - denies CPAP - na  Non-diabetic  Blood Thinner Instructions: denies Aspirin Instructions: denies  ERAS Protcol - Clears until 0430  Anesthesia review: No  Patient denies shortness of breath, fever, cough and chest pain at PAT appointment   All instructions explained to the patient, with a verbal understanding of the material. Patient agrees to go over the instructions while at home for a better understanding. Patient also instructed to self quarantine after being tested for COVID-19. The opportunity to ask questions was provided.

## 2024-04-17 ENCOUNTER — Ambulatory Visit (HOSPITAL_COMMUNITY): Admitting: Anesthesiology

## 2024-04-17 ENCOUNTER — Encounter (HOSPITAL_COMMUNITY): Payer: Self-pay | Admitting: General Surgery

## 2024-04-17 ENCOUNTER — Other Ambulatory Visit: Payer: Self-pay

## 2024-04-17 ENCOUNTER — Encounter (HOSPITAL_COMMUNITY)
Admission: RE | Disposition: A | Discharge status: Home / Self Care | Payer: Self-pay | Source: Home / Self Care | Attending: General Surgery

## 2024-04-17 ENCOUNTER — Ambulatory Visit (HOSPITAL_COMMUNITY)
Admission: RE | Admit: 2024-04-17 | Discharge: 2024-04-17 | Disposition: A | Discharge status: Home / Self Care | Attending: General Surgery | Admitting: General Surgery

## 2024-04-17 ENCOUNTER — Ambulatory Visit (HOSPITAL_BASED_OUTPATIENT_CLINIC_OR_DEPARTMENT_OTHER): Admitting: Anesthesiology

## 2024-04-17 DIAGNOSIS — D170 Benign lipomatous neoplasm of skin and subcutaneous tissue of head, face and neck: Secondary | ICD-10-CM | POA: Diagnosis not present

## 2024-04-17 DIAGNOSIS — Z79899 Other long term (current) drug therapy: Secondary | ICD-10-CM | POA: Insufficient documentation

## 2024-04-17 DIAGNOSIS — R221 Localized swelling, mass and lump, neck: Secondary | ICD-10-CM | POA: Diagnosis present

## 2024-04-17 DIAGNOSIS — M1991 Primary osteoarthritis, unspecified site: Secondary | ICD-10-CM | POA: Diagnosis not present

## 2024-04-17 DIAGNOSIS — I10 Essential (primary) hypertension: Secondary | ICD-10-CM | POA: Insufficient documentation

## 2024-04-17 HISTORY — PX: EXCISION MASS NECK: SHX6703

## 2024-04-17 SURGERY — EXCISION, MASS, NECK
Anesthesia: General | Site: Neck

## 2024-04-17 MED ORDER — CHLORHEXIDINE GLUCONATE 0.12 % MT SOLN
OROMUCOSAL | Status: AC
  Administered 2024-04-17: 15 mL via OROMUCOSAL
  Filled 2024-04-17: qty 15

## 2024-04-17 MED ORDER — OXYCODONE HCL 5 MG/5ML PO SOLN: 5.0000 mg | Freq: Once | ORAL | Status: DC | PRN

## 2024-04-17 MED ORDER — GABAPENTIN 300 MG PO CAPS: 300.0000 mg | ORAL_CAPSULE | ORAL | Status: AC

## 2024-04-17 MED ORDER — FENTANYL CITRATE (PF) 250 MCG/5ML IJ SOLN
INTRAMUSCULAR | Status: AC
  Filled 2024-04-17: qty 5

## 2024-04-17 MED ORDER — HYDROMORPHONE HCL 1 MG/ML IJ SOLN: 0.2500 mg | INTRAMUSCULAR | Status: DC | PRN

## 2024-04-17 MED ORDER — LACTATED RINGERS IV SOLN: INTRAVENOUS | Status: DC

## 2024-04-17 MED ORDER — CHLORHEXIDINE GLUCONATE 0.12 % MT SOLN: 15.0000 mL | Freq: Once | OROMUCOSAL | Status: AC

## 2024-04-17 MED ORDER — CEFAZOLIN SODIUM-DEXTROSE 2-4 GM/100ML-% IV SOLN
INTRAVENOUS | Status: AC
  Filled 2024-04-17: qty 100

## 2024-04-17 MED ORDER — PROPOFOL 10 MG/ML IV BOLUS
INTRAVENOUS | Status: DC | PRN
  Administered 2024-04-17: 200 mg via INTRAVENOUS

## 2024-04-17 MED ORDER — LIDOCAINE 2% (20 MG/ML) 5 ML SYRINGE
INTRAMUSCULAR | Status: DC | PRN
  Administered 2024-04-17: 80 mg via INTRAVENOUS

## 2024-04-17 MED ORDER — EPHEDRINE 5 MG/ML INJ
INTRAVENOUS | Status: AC
  Filled 2024-04-17: qty 5

## 2024-04-17 MED ORDER — IBUPROFEN 200 MG PO TABS: 600.0000 mg | ORAL_TABLET | Freq: Four times a day (QID) | ORAL | 0 refills | Status: AC

## 2024-04-17 MED ORDER — PROPOFOL 10 MG/ML IV BOLUS
INTRAVENOUS | Status: AC
  Filled 2024-04-17: qty 20

## 2024-04-17 MED ORDER — ACETAMINOPHEN 325 MG PO TABS: 650.0000 mg | ORAL_TABLET | Freq: Four times a day (QID) | ORAL | 0 refills | Status: AC

## 2024-04-17 MED ORDER — ORAL CARE MOUTH RINSE: 15.0000 mL | Freq: Once | OROMUCOSAL | Status: AC

## 2024-04-17 MED ORDER — DEXMEDETOMIDINE HCL IN NACL 80 MCG/20ML IV SOLN
INTRAVENOUS | Status: DC | PRN
  Administered 2024-04-17: 20 ug via INTRAVENOUS

## 2024-04-17 MED ORDER — ACETAMINOPHEN 500 MG PO TABS: 1000.0000 mg | ORAL_TABLET | Freq: Once | ORAL | Status: AC

## 2024-04-17 MED ORDER — FENTANYL CITRATE (PF) 250 MCG/5ML IJ SOLN
INTRAMUSCULAR | Status: DC | PRN
  Administered 2024-04-17: 75 ug via INTRAVENOUS
  Administered 2024-04-17: 50 ug via INTRAVENOUS
  Administered 2024-04-17: 75 ug via INTRAVENOUS
  Administered 2024-04-17: 50 ug via INTRAVENOUS

## 2024-04-17 MED ORDER — CELECOXIB 200 MG PO CAPS: 200.0000 mg | ORAL_CAPSULE | ORAL | Status: AC

## 2024-04-17 MED ORDER — LIDOCAINE 2% (20 MG/ML) 5 ML SYRINGE
INTRAMUSCULAR | Status: AC
  Filled 2024-04-17: qty 5

## 2024-04-17 MED ORDER — ROCURONIUM BROMIDE 10 MG/ML (PF) SYRINGE
PREFILLED_SYRINGE | INTRAVENOUS | Status: AC
Start: 2024-04-17 — End: 2024-04-17
  Filled 2024-04-17: qty 10

## 2024-04-17 MED ORDER — AMLODIPINE BESYLATE 10 MG PO TABS
10.0000 mg | ORAL_TABLET | Freq: Every day | ORAL | Status: DC
  Filled 2024-04-17: qty 1

## 2024-04-17 MED ORDER — SUGAMMADEX SODIUM 200 MG/2ML IV SOLN
INTRAVENOUS | Status: DC | PRN
  Administered 2024-04-17 (×2): 200 mg via INTRAVENOUS

## 2024-04-17 MED ORDER — CHLORHEXIDINE GLUCONATE CLOTH 2 % EX PADS: 6.0000 | MEDICATED_PAD | Freq: Once | CUTANEOUS | Status: DC

## 2024-04-17 MED ORDER — DEXMEDETOMIDINE HCL IN NACL 80 MCG/20ML IV SOLN
INTRAVENOUS | Status: AC
  Filled 2024-04-17: qty 20

## 2024-04-17 MED ORDER — DEXAMETHASONE SODIUM PHOSPHATE 10 MG/ML IJ SOLN
INTRAMUSCULAR | Status: DC | PRN
  Administered 2024-04-17: 10 mg via INTRAVENOUS

## 2024-04-17 MED ORDER — 0.9 % SODIUM CHLORIDE (POUR BTL) OPTIME
TOPICAL | Status: DC | PRN
  Administered 2024-04-17: 1000 mL

## 2024-04-17 MED ORDER — ACETAMINOPHEN 500 MG PO TABS
ORAL_TABLET | ORAL | Status: AC
  Administered 2024-04-17: 1000 mg
  Filled 2024-04-17: qty 2

## 2024-04-17 MED ORDER — EPHEDRINE SULFATE-NACL 50-0.9 MG/10ML-% IV SOSY
PREFILLED_SYRINGE | INTRAVENOUS | Status: DC | PRN
  Administered 2024-04-17: 5 mg via INTRAVENOUS

## 2024-04-17 MED ORDER — BUPIVACAINE-EPINEPHRINE 0.25% -1:200000 IJ SOLN
INTRAMUSCULAR | Status: DC | PRN
  Administered 2024-04-17: 17 mL

## 2024-04-17 MED ORDER — BUPIVACAINE-EPINEPHRINE (PF) 0.25% -1:200000 IJ SOLN
INTRAMUSCULAR | Status: AC
  Filled 2024-04-17: qty 30

## 2024-04-17 MED ORDER — DEXAMETHASONE SODIUM PHOSPHATE 10 MG/ML IJ SOLN
INTRAMUSCULAR | Status: AC
Start: 2024-04-17 — End: 2024-04-17
  Filled 2024-04-17: qty 1

## 2024-04-17 MED ORDER — AMISULPRIDE (ANTIEMETIC) 5 MG/2ML IV SOLN: 10.0000 mg | Freq: Once | INTRAVENOUS | Status: DC | PRN

## 2024-04-17 MED ORDER — GABAPENTIN 300 MG PO CAPS
ORAL_CAPSULE | ORAL | Status: AC
  Administered 2024-04-17: 300 mg via ORAL
  Filled 2024-04-17: qty 1

## 2024-04-17 MED ORDER — CEFAZOLIN SODIUM-DEXTROSE 2-4 GM/100ML-% IV SOLN
2.0000 g | INTRAVENOUS | Status: AC
  Administered 2024-04-17: 2 g via INTRAVENOUS

## 2024-04-17 MED ORDER — ROCURONIUM BROMIDE 10 MG/ML (PF) SYRINGE
PREFILLED_SYRINGE | INTRAVENOUS | Status: DC | PRN
  Administered 2024-04-17: 50 mg via INTRAVENOUS

## 2024-04-17 MED ORDER — MIDAZOLAM HCL 2 MG/2ML IJ SOLN
INTRAMUSCULAR | Status: AC
Start: 2024-04-17 — End: 2024-04-17
  Filled 2024-04-17: qty 2

## 2024-04-17 MED ORDER — OXYCODONE HCL 5 MG PO TABS: 5.0000 mg | ORAL_TABLET | Freq: Once | ORAL | Status: DC | PRN

## 2024-04-17 MED ORDER — ONDANSETRON HCL 4 MG/2ML IJ SOLN
INTRAMUSCULAR | Status: AC
Start: 2024-04-17 — End: 2024-04-17
  Filled 2024-04-17: qty 2

## 2024-04-17 MED ORDER — CELECOXIB 200 MG PO CAPS
ORAL_CAPSULE | ORAL | Status: AC
  Administered 2024-04-17: 200 mg via ORAL
  Filled 2024-04-17: qty 1

## 2024-04-17 MED ORDER — ONDANSETRON HCL 4 MG/2ML IJ SOLN
INTRAMUSCULAR | Status: DC | PRN
  Administered 2024-04-17: 4 mg via INTRAVENOUS

## 2024-04-17 MED ORDER — AMLODIPINE BESYLATE 10 MG PO TABS
10.0000 mg | ORAL_TABLET | Freq: Once | ORAL | Status: AC
  Administered 2024-04-17: 10 mg via ORAL
  Filled 2024-04-17: qty 1

## 2024-04-17 MED ORDER — MIDAZOLAM HCL 2 MG/2ML IJ SOLN
INTRAMUSCULAR | Status: DC | PRN
  Administered 2024-04-17: 2 mg via INTRAVENOUS

## 2024-04-17 MED ORDER — ACETAMINOPHEN 500 MG PO TABS: 1000.0000 mg | ORAL_TABLET | Freq: Once | ORAL | Status: DC

## 2024-04-17 MED ORDER — OXYCODONE HCL 5 MG PO TABS: 5.0000 mg | ORAL_TABLET | Freq: Three times a day (TID) | ORAL | 0 refills | Status: AC | PRN

## 2024-04-17 MED ORDER — ONDANSETRON HCL 4 MG/2ML IJ SOLN: 4.0000 mg | Freq: Once | INTRAMUSCULAR | Status: DC | PRN

## 2024-04-17 MED ORDER — ACETAMINOPHEN 500 MG PO TABS: 1000.0000 mg | ORAL_TABLET | ORAL | Status: AC

## 2024-04-17 SURGICAL SUPPLY — 32 items
BAG COUNTER SPONGE SURGICOUNT (BAG) ×1 IMPLANT
BENZOIN TINCTURE PRP APPL 2/3 (GAUZE/BANDAGES/DRESSINGS) IMPLANT
BLADE CLIPPER SURG (BLADE) IMPLANT
CANISTER SUCTION 3000ML PPV (SUCTIONS) IMPLANT
CHLORAPREP W/TINT 26 (MISCELLANEOUS) ×1 IMPLANT
COVER SURGICAL LIGHT HANDLE (MISCELLANEOUS) ×1 IMPLANT
DERMABOND ADVANCED .7 DNX12 (GAUZE/BANDAGES/DRESSINGS) IMPLANT
DRAPE LAPAROTOMY 100X72 PEDS (DRAPES) IMPLANT
DRAPE SURG ORHT 6 SPLT 77X108 (DRAPES) IMPLANT
ELECTRODE REM PT RTRN 9FT ADLT (ELECTROSURGICAL) ×1 IMPLANT
GAUZE SPONGE 4X4 12PLY STRL (GAUZE/BANDAGES/DRESSINGS) IMPLANT
GLOVE BIO SURGEON STRL SZ7 (GLOVE) ×1 IMPLANT
GLOVE SURG SS PI 7.0 STRL IVOR (GLOVE) IMPLANT
GOWN STRL REUS W/ TWL LRG LVL3 (GOWN DISPOSABLE) ×1 IMPLANT
GOWN STRL REUS W/ TWL XL LVL3 (GOWN DISPOSABLE) ×1 IMPLANT
KIT BASIN OR (CUSTOM PROCEDURE TRAY) ×1 IMPLANT
KIT TURNOVER KIT B (KITS) ×1 IMPLANT
MARKER SKIN DUAL TIP RULER LAB (MISCELLANEOUS) ×1 IMPLANT
NDL HYPO 25GX1X1/2 BEV (NEEDLE) ×1 IMPLANT
NEEDLE HYPO 25GX1X1/2 BEV (NEEDLE) ×1 IMPLANT
NS IRRIG 1000ML POUR BTL (IV SOLUTION) ×1 IMPLANT
PACK GENERAL/GYN (CUSTOM PROCEDURE TRAY) ×1 IMPLANT
PAD ARMBOARD POSITIONER FOAM (MISCELLANEOUS) ×1 IMPLANT
SPECIMEN JAR SMALL (MISCELLANEOUS) ×1 IMPLANT
STRIP CLOSURE SKIN 1/2X4 (GAUZE/BANDAGES/DRESSINGS) IMPLANT
SUT MNCRL AB 4-0 PS2 18 (SUTURE) ×1 IMPLANT
SUT VIC AB 2-0 SH 18 (SUTURE) IMPLANT
SUT VIC AB 3-0 SH 18 (SUTURE) ×1 IMPLANT
SYR CONTROL 10ML LL (SYRINGE) ×1 IMPLANT
TOWEL GREEN STERILE (TOWEL DISPOSABLE) ×1 IMPLANT
TOWEL GREEN STERILE FF (TOWEL DISPOSABLE) ×1 IMPLANT
UNDERPAD 30X36 HEAVY ABSORB (UNDERPADS AND DIAPERS) IMPLANT

## 2024-04-17 NOTE — Op Note (Signed)
 04/17/2024  8:27 AM  PATIENT:  [name].  58 y.o. male  Patient Care Team: Ozell Heron HERO, MD as PCP - General (Family Medicine)  PRE-OPERATIVE DIAGNOSIS:  Neck mass  POST-OPERATIVE DIAGNOSIS:  Same (2cm x 2cm x 2cm)  PROCEDURE:  Excision of neck mass (2cm x 2cm x 2cm)  SURGEON:  Cordella RONAL Idler, MD  ASSISTANT: None  ANESTHESIA:   general  COUNTS:  Sponge, needle and instrument counts were reported correct x2 at the conclusion of the operation.  EBL: Minimal  DRAINS: None  SPECIMEN: Neck mass  COMPLICATIONS: None  FINDINGS: Neck mass consistent with isolated lymph node  DISPOSITION: PACU in satisfactory condition  INDICATION: [name] is a 58 y/o M who presented with a neck mass that had grown recently and become symptomatic. I offered to take him to the OR for excision for diagnosis and treatment. All of his questions were addressed and written consent was obtained.   DESCRIPTION: The patient was identified in preop holding and taken to the OR where he was placed on the operating room table. SCDs were placed. General endotracheal anesthesia was induced without difficulty. His right neck was then prepped and draped in the usual sterile fashion. A surgical timeout was performed indicating the correct patient, procedure, positioning and need for preoperative antibiotics.  The previously marked area near the right neck was identified. A incision was made overlying the mass using a #15 blade and carried down through the subcutaneous tissue using electrocautery. The mass was encountered and appeared to be a lymph node. The mass was freed from surrounding tissues using blunt dissection and electrocautery, excised, and then passed off the field to be sent as a specimen. The mass measured 2cm x 2cm x 2cm. The wound was irrigated with sterile saline and inspected for hemostasis. The deep layers were closed with interrupted 3-0 vicryl. Interrupted 3-0 vicryl were used to  approximate the dermis followed by a running 4-0 subcuticular monocryl. A field block was performed using 0.25% marcaine  with epinephrine . A layer of dermabond was applied.

## 2024-04-17 NOTE — Anesthesia Postprocedure Evaluation (Signed)
 Anesthesia Post Note  Patient: Erik L Ortwein Jr.  Procedure(s) Performed: EXCISION, MASS, NECK (Neck)     Patient location during evaluation: PACU Anesthesia Type: General Level of consciousness: awake and alert, oriented and patient cooperative Pain management: pain level controlled Vital Signs Assessment: post-procedure vital signs reviewed and stable Respiratory status: spontaneous breathing, nonlabored ventilation and respiratory function stable Cardiovascular status: blood pressure returned to baseline and stable Postop Assessment: no apparent nausea or vomiting Anesthetic complications: no   No notable events documented.  Last Vitals:  Vitals:   04/17/24 0900 04/17/24 0905  BP: (!) 158/93   Pulse: 67 61  Resp: 19 (!) 21  Temp:  (!) 36.4 C  SpO2: 95% 96%    Last Pain:  Vitals:   04/17/24 0905  TempSrc:   PainSc: 0-No pain                 Almarie CHRISTELLA Marchi

## 2024-04-17 NOTE — Discharge Instructions (Signed)
 Outpatient Surgery Home Care Instruction  Activity  The effects of anesthesia are still present and drowsiness may result.  Limit activity for the first 24 hours, then you may return to normal daily activities. Returning to normal daily activities as soon as you can following surgery will enhance recovery time.  Do not drive or operate heavy machinery within 24 hours of taking narcotic pain medications.   Do not mow the lawn, use a vacuum cleaner, or do any other strenuous activities without first consulting your surgical team.   Diet Drink plenty of fluids and eat light meals today, then resume regular diet. Some patients may find their appetite is poor for a week or two after surgery. This is a normal result of the stress of surgery-your appetite will return in time.   There are no specific diet restrictions after surgery.   Dressing and Wound Care  Keep your wound or incision site clean and dry.  You may have different types of dressings covering your incisions depending on your operation and your surgeon: o Dermabond/Durabond (skin glue): This will usually remain in place for 10-14 days, then naturally fall off your skin. You may take a shower 24 hrs after surgery, carefully wash, not scrub the incision site with a mild non-scented soap. Pat dry with a soft towel.  Do not pick or peel skin glue off.  You can shower and let the water fall on the dressings above. Do not soak or submerge your incision(s) in a bath tub, hot tub, or swimming pool, until your doctor says it is ok to do so or the incision(s) have completely healed, usually about 2-4 weeks.  Do not use creams, powder, salves or balms on your incision(s).  What to Expect After Surgery   Moderate discomfort controlled with medications  Minimal drainage from incision  Feeling fatigue and weak  Constipation after surgery is common. Drink plenty fluids and eat a high fiber diet.   Pain Control: Prescribed Non-Narcotic Pain  Medication  You will be given three prescriptions.  Two of them will be for prescription strength ibuprofen (i.e. Advil) and prescription strength acetaminophen (i.e. Tylenol).  The vast majority of patients will just need these two medications.  One prescription will be for a 'rescue' prescription of an oral narcotic (oxycodone).  You may fill this if needed.  You will alternate taking the ibuprofen (600mg ) every 6 hours and also the acetaminophen (650mg ) every 6 hours so that you are taking one of those medications every 3 hours.  For example: o 0800 - take ibuprofen 600mg  o 1100 - take acetaminophen 650mg  o 1400 - take ibuprofen 600mg  o 1700 - take acetaminophen 650mg  o Etc.  Continue taking this alternating pattern of ibuprofen and acetaminophen for 3 days  If you cannot take one or the other of these medications, just take the one you can every 6 hours.  If you are comfortable at night, you don't have to wake up and take a medication.  If you are still uncomfortable after taking either ibuprofen or acetaminophen, try gentle stretching exercise and ice packs (a bag of frozen vegetables works great).  If you are still uncomfortable, you may fill the narcotic prescription of Oxycodone and take as directed.  Once you have completed these prescriptions, your pain level should be low enough to stop taking medications altogether or just use an over the counter medication (ibuprofen or acetaminophen) as needed.    Pain Control: Over the Counter Medications to take as  needed  Colace/Docusate: May be prescribed by your surgeon to prevent constipation caused by the combination of narcotics, effects of anesthesia, and decreased ambulation.  Hold for loose stools or diarrhea. Take 100 mg 1-2 times a day starting tonight.   Fiber: High fiber foods, extra liquids (water 9-13 cups/day) can also assist with constipation. Examples of high fiber foods are fruit, bran. Prune juice and water are also good liquids  to drink.  Milk of Magnesia/Miralax:  If constipated despite takeing the over the counter stool softeners, you may take Milk of Magnesia or Miralax as directed on bottle to assist with constipation.     Pepcid/Famotidine: May be prescribed while taking naproxen (Aleve) or other NSAIDs such as ibuprofen (Motrin/Advil) to prevent stomach upset or Acid-reflux symptoms. Take 1 tablet 1-2 times a day.   **Constipation: The first bowel movement may occur anywhere between 1-5 days after surgery.  As long as you are not nauseated or not having significant abdominal pain this variation is acceptable. Narcotic pain medications can cause constipation increasing discomfort; early discontinuation will assist with bowel management. If constipated despite taking stool softeners, you may take Milk of Magnesia or Miralax as directed on the bottle.     **Home medications: You may restart your home medications as directed by your respective Primary Care Physician or Surgeon.   When to notify your Doctor or Healthcare Team   Sign of Wound Infection   Fever over 100 degrees.  Wound becomes extremely swollen, shows red streaks, warm to the touch, and/or drainage from the incision site or foul-smelling drainage.  Wound edges separate or opens up  Bleeding or bruising   If you have bleeding, apply pressure to the site and hold the pressure firmly for 5 minutes. If the bleeding continues, apply pressure again and call 911. If the bleeding stopped, call your doctor to report it.   Call your doctor or nurse if you have increased bleeding from your site and increased bruising or a lump forms or gets larger under your skin at the site. Unrelieved Pain   Call your doctor or nurse if your pain gets worse or is not eased 1 hour after taking your pain medicine, or if it is severe and uncontrolled. Nausea and Vomiting   Call your doctor or nurse if you have nausea and vomiting that continues more than 24 hours, will not let you  keep medicine down and will not let you keep fluids down  Fever, Flu-like symptoms   Fever over 100 degrees and/or chills  Gastrointestinal Bleeding Symptoms    Black tarry bowel movements.  This can be normal after surgery on the stomach, but should resolve in a day or two.    Call 911 if you suddenly have signs of blood loss such as:  Vomiting blood  Fast heart rate  Feeling faint, sweaty, or blacking out  Passing bright red blood from your rectum  Blood Clot Symptoms   Tender, swollen or reddened areas in your calf muscle or thighs.  Numbness or tingling in your lower leg or calf, or at the top of your leg or groin  Skin on your leg looks pale or blue or feels cold to touch  Chest pain or have trouble breathing, lightheadedness, fast heart rate  Sudden Onset of Symptoms    Call 911 if you suddenly have:  Leg weakness and spasm  Loss of bladder or bowel function  Seizure  Confusion, severe headache, dizziness or feeling unsteady, problems talking, difficulty  swallowing, and/or numbness or muscle weakness as these could be signs of a stroke.  Follow up Appointment Your follow up appointment should be scheduled 2-3 weeks after your surgery date.  If you have not previously scheduled for a follow-up visit you can be scheduled by contacting [phone].

## 2024-04-17 NOTE — Transfer of Care (Signed)
 Immediate Anesthesia Transfer of Care Note  Patient: Erik L Prouse Jr.  Procedure(s) Performed: EXCISION, MASS, NECK (Neck)  Patient Location: PACU  Anesthesia Type:General  Level of Consciousness: awake, alert , and oriented  Airway & Oxygen Therapy: Patient Spontanous Breathing and Patient connected to face mask oxygen  Post-op Assessment: Report given to RN and Post -op Vital signs reviewed and stable  Post vital signs: Reviewed and stable  Last Vitals:  Vitals Value Taken Time  BP 168/97 04/17/24 08:34  Temp    Pulse 82 04/17/24 08:38  Resp 28 04/17/24 08:38  SpO2 100 % 04/17/24 08:38  Vitals shown include unfiled device data.  Last Pain:  Vitals:   04/17/24 0603  TempSrc:   PainSc: 0-No pain         Complications: No notable events documented.

## 2024-04-17 NOTE — Anesthesia Procedure Notes (Signed)
 Procedure Name: Intubation Date/Time: 04/17/2024 7:41 AM  Performed by: Mannie Krystal LABOR, CRNAPre-anesthesia Checklist: Patient identified, Emergency Drugs available, Suction available and Patient being monitored Patient Re-evaluated:Patient Re-evaluated prior to induction Oxygen Delivery Method: Circle system utilized Preoxygenation: Pre-oxygenation with 100% oxygen Induction Type: IV induction Ventilation: Mask ventilation without difficulty Laryngoscope Size: Miller and 3 Grade View: Grade I Tube type: Oral Tube size: 7.5 mm Number of attempts: 1 Airway Equipment and Method: Stylet and Oral airway Placement Confirmation: ETT inserted through vocal cords under direct vision, positive ETCO2 and breath sounds checked- equal and bilateral Secured at: 22 cm Tube secured with: Tape Dental Injury: Teeth and Oropharynx as per pre-operative assessment

## 2024-04-17 NOTE — Anesthesia Preprocedure Evaluation (Addendum)
 Anesthesia Evaluation  Patient identified by MRN, date of birth, ID band Patient awake    Reviewed: Allergy & Precautions, H&P , NPO status , Patient's Chart, lab work & pertinent test results  Airway Mallampati: III  TM Distance: >3 FB Neck ROM: Full    Dental  (+) Teeth Intact, Dental Advisory Given   Pulmonary  Snores, no sleep study    Pulmonary exam normal breath sounds clear to auscultation       Cardiovascular hypertension (165/99 preop), Pt. on medications Normal cardiovascular exam Rhythm:Regular Rate:Normal     Neuro/Psych  negative psych ROS   GI/Hepatic negative GI ROS, Neg liver ROS,,,  Endo/Other  negative endocrine ROS    Renal/GU negative Renal ROS  negative genitourinary   Musculoskeletal  (+) Arthritis , Osteoarthritis,    Abdominal   Peds negative pediatric ROS (+)  Hematology negative hematology ROS (+)   Anesthesia Other Findings   Reproductive/Obstetrics negative OB ROS                              Anesthesia Physical Anesthesia Plan  ASA: 2  Anesthesia Plan: General   Post-op Pain Management: Tylenol  PO (pre-op)*   Induction: Intravenous  PONV Risk Score and Plan: 2 and Ondansetron , Dexamethasone , Midazolam  and Treatment may vary due to age or medical condition  Airway Management Planned: Oral ETT and Video Laryngoscope Planned  Additional Equipment: None  Intra-op Plan:   Post-operative Plan: Extubation in OR  Informed Consent: I have reviewed the patients History and Physical, chart, labs and discussed the procedure including the risks, benefits and alternatives for the proposed anesthesia with the patient or authorized representative who has indicated his/her understanding and acceptance.     Dental advisory given  Plan Discussed with: CRNA  Anesthesia Plan Comments:         Anesthesia Quick Evaluation

## 2024-04-17 NOTE — H&P (Signed)
     Erik L Bucklew Jr. July 23, 1966  [id number].    HPI:  58 y/o M w/ a symptomatic neck mass who presents for excision. He reports that he is in his usual state of health and denies any recent changes in medication.   ROS: Review of Systems  Constitutional: Negative.   HENT: Negative.    Eyes: Negative.   Respiratory: Negative.    Cardiovascular: Negative.   Gastrointestinal: Negative.   Genitourinary: Negative.   Musculoskeletal: Negative.   Skin:        Neck mass  Neurological: Negative.   Endo/Heme/Allergies: Negative.   Psychiatric/Behavioral: Negative.      Family History  Problem Relation Age of Onset   Cancer Mother    Hypertension Father    Cancer Sister    Hypertension Brother     Past Medical History:  Diagnosis Date   Hypertension     Past Surgical History:  Procedure Laterality Date   ENDOVENOUS ABLATION SAPHENOUS VEIN W/ LASER  01/08/2013   left leg for chronic superficial venous indufficiency, by Dr. Patty    NECK SURGERY  2023   TOTAL HIP ARTHROPLASTY Right 2021    Social History:  reports that he has never smoked. He has never used smokeless tobacco. He reports that he does not currently use alcohol. He reports that he does not use drugs.  Allergies: No Known Allergies  Medications Prior to Admission  Medication Sig Dispense Refill   amLODipine  (NORVASC ) 10 MG tablet Take 1 tablet (10 mg total) by mouth daily. 90 tablet 1   colchicine  0.6 MG tablet Take 0.6 mg by mouth daily as needed (gout flares).     ibuprofen  (ADVIL ) 200 MG tablet Take 400 mg by mouth every 8 (eight) hours as needed (pain.).     sildenafil  (VIAGRA ) 100 MG tablet Take 1 tablet (100 mg total) by mouth daily as needed for erectile dysfunction. 30 tablet 5    Physical Exam: Blood pressure (!) 165/99, pulse (!) 55, temperature 98 F (36.7 C), temperature source Oral, resp. rate 17, height 6' 2 (1.88 m), weight 103 kg, SpO2 97%. Gen: male, NAD HEENT: subcutaneous mass  along the right neck marked  No results found for this or any previous visit (from the past 48 hours). No results found.  Assessment/Plan 58 y/o with a neck mass  - Will proceed to the OR. We discussed the alternatives and potential risks of surgery, including but not limited to: bleeding, infection, recurrence, and wound complications. All questions were addressed and consent was obtained.    [name] Surgery 04/17/2024, 7:04 AM Please see Amion for pager number during day hours 7:00am-4:30pm or 7:00am -11:30am on weekends

## 2024-04-18 ENCOUNTER — Encounter (HOSPITAL_COMMUNITY): Payer: Self-pay | Admitting: General Surgery

## 2024-04-18 LAB — SURGICAL PATHOLOGY

## 2024-04-28 ENCOUNTER — Other Ambulatory Visit: Payer: Self-pay | Admitting: Family Medicine

## 2024-04-28 DIAGNOSIS — E538 Deficiency of other specified B group vitamins: Secondary | ICD-10-CM

## 2024-05-09 ENCOUNTER — Telehealth: Payer: Self-pay | Admitting: Family Medicine

## 2024-05-09 NOTE — Telephone Encounter (Signed)
 Recommend pt go to the [facility] health behavioral health urgent care. They should be able to get him evaluated and give him the resources.

## 2024-05-09 NOTE — Telephone Encounter (Signed)
 Copied from CRM #[id number]. Topic: Referral - Question >> May 09, 2024  8:53 AM Laymon HERO wrote: Reason for CRM: Patient wanting to get a referral to see a facility for Substance Abuse.

## 2024-05-09 NOTE — Telephone Encounter (Signed)
 Spoke with the patient, informed him of the message below and he was given the address and phone number-[phone].

## 2024-05-15 ENCOUNTER — Ambulatory Visit (HOSPITAL_COMMUNITY): Admission: EM | Admit: 2024-05-15 | Discharge: 2024-05-15 | Disposition: A | Discharge status: Home / Self Care

## 2024-05-15 DIAGNOSIS — F1490 Cocaine use, unspecified, uncomplicated: Secondary | ICD-10-CM

## 2024-05-15 NOTE — Discharge Instructions (Addendum)
 You have been referred to the Chemical Dependency Intensive Outpatient Program: You are scheduled for an intake appointment on May 21, 2024 at 2:30 PM. If you have any questions or concerns please call [phone].  Go to the [facility] (2nd floor) located at [address] [place], [place] [address].  Specialized Group Therapy for Substance Abuse If you have a substance abuse disorder (with or without a mental health condition), you may benefit from specialized substance abuse therapy in a group setting. According to discharge survey data, 70 percent of patients completing our chemical dependency intensive outpatient program report fewer symptoms of substance abuse and incidents of relapse.  Under the guidance of a licensed mental health professional, meet with your peers every Monday, Wednesday and Friday from 9 a.m. to 12 p.m. for 6 to 8 weeks to:  Learn about chemical dependency, mental illness and co-occurring disorders. Develop relapse-prevention skills. Set personalized goals with your treatment team. To build on the skills you gain, you can attend Alcoholics Anonymous or Narcotics Anonymous meetings in the evenings and access follow-up care through weekly group meetings with peers. Ongoing support promotes wellness and recovery.  Safety:   The following safety precautions should be taken:   No sharp objects. This includes scissors, razors, scrapers, and putty knives.   Chemicals should be removed and locked up.   Medications should be removed and locked up.   Weapons should be removed and locked up. This includes firearms, knives and instruments that can be used to cause injury.   The patient should abstain from use of illicit substances/drugs and abuse of any medications.  If symptoms worsen or do not continue to improve or if the patient becomes actively suicidal or homicidal then it is recommended that the patient return to the  closest hospital emergency department, the [facility], or call 911 for further evaluation and treatment. National Suicide Prevention Lifeline 1-800-SUICIDE or (463)665-7629.  About 988 988 offers 24/7 access to trained crisis counselors who can help people experiencing mental health-related distress. People can call or text 988 or chat 988lifeline.org for themselves or if they are worried about a loved one who may need crisis support.   Cocaine Use  Cocaine use disorder is a condition in which your cocaine use disrupts your daily life. Cocaine belongs to a group of powerful drugs known as stimulants. Other names for cocaine include coke, crack, blow, snow, C, powder, and nose candy. This drug has some medical uses, but these are rare. Cocaine is most often misused because of its effects, which include: A feeling of extreme pleasure (euphoria). Alertness. A high energy level. Cocaine use disorder can be dangerous because: Cocaine increases your blood pressure. Using cocaine can lead to a heart attack or stroke. Cocaine use can cause fast or irregular heartbeats and seizures. Non-medical cocaine use is illegal and can lead to criminal charges. Cocaine use can be life-threatening. What are the causes? This condition is caused by misusing cocaine. Many people start using cocaine because it makes them feel good or helps them deal with their lives. Over time, they get addicted to it. When they try to stop using it, they feel sick. What increases the risk? The following factors may make you more likely to develop this condition: Misusing other drugs. A family history of misusing drugs. History of mental illness. What are the signs or symptoms? Symptoms of this condition include: Craving cocaine, which can lead to: Using more cocaine  than you want to, or using cocaine for longer than you want to. Being unable to slow down or stop your cocaine use. Needing more and  more cocaine to get the same effects (building up a tolerance). Spending a lot of time getting cocaine, using it, or recovering from its effects. Having problems at work, at school, at home, or with relationships because of cocaine use. Giving up or cutting down on important life activities because of cocaine use. Using cocaine when it is dangerous, such as when driving a car. Continuing to use cocaine even though it has led to physical problems, such as: Poor nutrition. Nosebleeds. Chest pain. Continuing to use cocaine even though it is affecting your mental health. You may develop: Schizophrenia-like symptoms. You may think or act in an unusual way. Depression. Bipolar mood swings. Anxiety. Sleep problems. If you stop using cocaine, you may have symptoms of withdrawal, such as: Depression. Bad dreams or sleep problems. Tiredness (fatigue) or slowed thinking. Increased appetite. How is this diagnosed? This condition is diagnosed based on: A physical exam. Your history of cocaine use. Your symptoms. This includes: How cocaine use affects your life. Changes in personality, behaviors, and mood. Health or mental health issues related to using cocaine. Blood or urine tests to screen for drugs. How is this treated? The first goal of treatment is to stop your use of cocaine. This must be done safely and may involve: Taking part in group and individual counseling from specially trained mental health providers. Staying at a residential treatment center for several days or weeks. Attending daily counseling sessions at a treatment center. Taking medicines as told by your health care provider that: Ease symptoms and prevent complications during withdrawal. Block cravings and block the good feeling that you get from using cocaine. Treat other mental health issues, such as depression or anxiety. Participating in a support group to share your experience with others who are going through the  same thing. Recovery can be a long process. Many people who undergo treatment start using the drug again after stopping (relapse). If you relapse, it does not mean that treatment will not work. Follow these instructions at home: Medicines Take over-the-counter and prescription medicines only as told by your health care provider. Check with your health care provider before starting any new medicines, vitamins, herbs, or supplements. General instructions     Do not use any drugs or alcohol. Do not use any products that contain nicotine or tobacco. These products include cigarettes, chewing tobacco, and vaping devices, such as e-cigarettes. If you need help quitting, ask your health care provider. Avoid people and activities that trigger your use of cocaine. Learn and practice techniques for managing stress. Have a plan for vulnerable moments. Get phone numbers of those who are willing to help and who are committed to your recovery. Attend support groups regularly for emotional support, advice, and guidance. Keep all follow-up visits. This is important for recovery and includes continuing to work with therapists and support groups. Where to find more information [employer] on Drug Abuse: SkinCoat.nl Substance Abuse and Mental Health Services Administration: RockToxic.pl Narcotics Anonymous: DestructiveBlog.cz Contact a health care provider if: Your symptoms get worse. You use cocaine again. You cannot take your medicines as told. Get help right away if you have: Chest pain. Any symptoms of a stroke. BE FAST is an easy way to remember the main warning signs of a stroke: B - Balance. Signs are dizziness, sudden trouble walking, or loss of balance.  E - Eyes. Signs are trouble seeing or a sudden change in vision. F - Face. Signs are sudden weakness or numbness of the face, or the face or eyelid drooping on one side. A - Arms. Signs are weakness or numbness in an arm. This happens suddenly and  usually on one side of the body. S - Speech. Signs are sudden trouble speaking, slurred speech, or trouble understanding what people say. T - Time. Time to call emergency services. Write down what time symptoms started. Other signs of a stroke, such as: A sudden, severe headache with no known cause. Nausea or vomiting. Seizure. These symptoms may be an emergency. Get help right away. Call 911. Do not wait to see if the symptoms will go away. Do not drive yourself to the hospital. Also, get help right away if: You have serious thoughts about hurting yourself or others. Take one of these steps if you feel like you may hurt yourself or others, or have thoughts about taking your own life: Go to your nearest emergency room. Call 911. Call the National Suicide Prevention Lifeline at (867) 003-9467 or 988. This is open 24 hours a day. Text the Crisis Text Line at 774 781 2753. Summary Cocaine has some medical uses, but these are rare. Cocaine is most often misused because of its effects. Many people start using cocaine because it makes them feel good or helps them deal with their lives. Over time, they get addicted to it. Treatment for this condition is usually provided by specially trained mental health providers. This information is not intended to replace advice given to you by your health care provider. Make sure you discuss any questions you have with your health care provider.

## 2024-05-15 NOTE — ED Provider Notes (Signed)
 Behavioral Health Urgent Care Medical Screening Exam  Patient Name: Erik L Plascencia Jr. MRN: [id number] Date of Evaluation: 05/15/24 Chief Complaint:  I need help with using cocaine.  Diagnosis:  Final diagnoses:  Cocaine use    History of Present illness: [name]. is a 58 y.o. male patient with no past psychiatric history who presented to the [facility] voluntary requesting substance abuse treatment for cocaine use. He reports using cocaine intermittently for the past 7 years. He states that his cocaine use has gotten out of hand and that he's been using more than usual. He denies recent stressors or triggers attributing to his cocaine use. He reports using cocaine two times per week, on average, he spends about $80.00/week on cocaine. He states that he last used cocaine yesterday, $40.00 worth. He states that today he showed up to work jacked up from using cocaine yesterday and his boss sent him home and told him not to come back. 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 He reports a sobriety of 3 months for surgery to his lymph node/neck and states that he relapsed on cocaine this month. He denies a history of substance abuse treatment. He denies using other illicit drugs. He denies drinking alcohol. He denies psychiatric symptoms. No SI/HI/AVH, depression, anxiety or mania. He denies a past history of suicide attempts. He reports a medical history of hypertension and states that he is prescribed blood pressure medication. However, he is unable to recall the name of the blood pressure medication he is currently prescribed. He denies physical symptoms. He resides with his ex-wife. He has one adult daughter.   Plan of care: Patient does not meet criteria for inpatient detoxification for cocaine use and can benefit from residential or outpatient substance abuse programs for substance use. Patient referred to the [facility] CD-IOP for substance abuse counseling and medication management. Patient scheduled for September 24, 25 at 2:30 PM for an intake appointment for CD-IOP. Safety planning completed prior to discharge. Patient counseled on the risk of cocaine use and advised to refrain from using illicit drugs.   Flowsheet Row ED from 05/15/2024 in [facility] ED from 03/01/2023 in [facility] Emergency Department at [facility] ED from 02/26/2023 in [facility] Emergency Department at [facility]  C-SSRS RISK CATEGORY No Risk No Risk No Risk    Psychiatric Specialty Exam  Presentation  General Appearance:Appropriate for Environment  Eye Contact:Fair  Speech:Clear and Coherent  Speech Volume:Normal  Handedness:Right   Mood and Affect  Mood:Euthymic  Affect:Congruent   Thought Process  Thought Processes:Coherent  Descriptions of Associations:Intact  Orientation:Full (Time, Place and Person)  Thought Content:Logical    Hallucinations:None  Ideas of Reference:None  Suicidal Thoughts:No  Homicidal Thoughts:No   Sensorium  Memory:Immediate Fair; Recent Fair; Remote Fair  Judgment:Fair  Insight:Fair   Executive Functions  Concentration:Fair  Attention Span:Fair  Recall:Fair  Fund of Knowledge:Fair  Language:Fair   Psychomotor Activity  Psychomotor Activity:Normal   Assets  Assets:Communication Skills; Desire for Improvement; Leisure Time; Physical Health; Social Support; Housing   Sleep  Sleep:Fair  Physical Exam: Physical Exam HENT:     Head: Atraumatic.     Nose: Nose normal.  Eyes:     Conjunctiva/sclera: Conjunctivae normal.  Cardiovascular:     Rate and Rhythm: Normal rate.  Pulmonary:     Effort: Pulmonary effort is normal.  Musculoskeletal:  General: Normal range of motion.  Neurological:     Mental Status: He is alert and oriented to person, place, and time.    Review of  Systems  Constitutional: Negative.   HENT: Negative.    Eyes: Negative.   Respiratory: Negative.    Cardiovascular: Negative.   Gastrointestinal: Negative.   Genitourinary: Negative.   Musculoskeletal: Negative.   Neurological: Negative.   Endo/Heme/Allergies: Negative.   Psychiatric/Behavioral:  Positive for substance abuse.    Blood pressure 136/88, pulse 86, temperature 98.3 F (36.8 C), temperature source Oral, resp. rate 17, SpO2 100%. There is no height or weight on file to calculate BMI.  Musculoskeletal: Strength & Muscle Tone: within normal limits Gait & Station: normal Patient leans: N/A   [facility] MSE Discharge Disposition for Follow up and Recommendations: Based on my evaluation the patient does not appear to have an emergency medical condition and can be discharged with resources and follow up care in outpatient services for Substance Abuse Intensive Outpatient Program  You have been referred to the Chemical Dependency Intensive Outpatient Program: You are scheduled for an intake appointment on May 21, 2024 at 2:30 PM. If you have any questions or concerns please call [phone].  Go to the [facility] (2nd floor) located at [address] [place], [place] [address].  Specialized Group Therapy for Substance Abuse If you have a substance abuse disorder (with or without a mental health condition), you may benefit from specialized substance abuse therapy in a group setting. According to discharge survey data, 70 percent of patients completing our chemical dependency intensive outpatient program report fewer symptoms of substance abuse and incidents of relapse.  Under the guidance of a licensed mental health professional, meet with your peers every Monday, Wednesday and Friday from 9 a.m. to 12 p.m. for 6 to 8 weeks to:  Learn about chemical dependency, mental illness and co-occurring disorders. Develop relapse-prevention  skills. Set personalized goals with your treatment team. To build on the skills you gain, you can attend Alcoholics Anonymous or Narcotics Anonymous meetings in the evenings and access follow-up care through weekly group meetings with peers. Ongoing support promotes wellness and recovery.  Safety:   The following safety precautions should be taken:   No sharp objects. This includes scissors, razors, scrapers, and putty knives.   Chemicals should be removed and locked up.   Medications should be removed and locked up.   Weapons should be removed and locked up. This includes firearms, knives and instruments that can be used to cause injury.   The patient should abstain from use of illicit substances/drugs and abuse of any medications.  If symptoms worsen or do not continue to improve or if the patient becomes actively suicidal or homicidal then it is recommended that the patient return to the closest hospital emergency department, the [facility], or call 911 for further evaluation and treatment. National Suicide Prevention Lifeline 1-800-SUICIDE or 2037730820.  About 988 988 offers 24/7 access to trained crisis counselors who can help people experiencing mental health-related distress. People can call or text 988 or chat 988lifeline.org for themselves or if they are worried about a loved one who may need crisis support.   Cocaine Use  Cocaine use disorder is a condition in which your cocaine use disrupts your daily life. Cocaine belongs to a group of powerful drugs known as stimulants. Other names for cocaine include coke, crack, blow, snow, C, powder, and nose candy. This drug  has some medical uses, but these are rare. Cocaine is most often misused because of its effects, which include: A feeling of extreme pleasure (euphoria). Alertness. A high energy level. Cocaine use disorder can be dangerous because: Cocaine increases your blood pressure. Using  cocaine can lead to a heart attack or stroke. Cocaine use can cause fast or irregular heartbeats and seizures. Non-medical cocaine use is illegal and can lead to criminal charges. Cocaine use can be life-threatening. What are the causes? This condition is caused by misusing cocaine. Many people start using cocaine because it makes them feel good or helps them deal with their lives. Over time, they get addicted to it. When they try to stop using it, they feel sick. What increases the risk? The following factors may make you more likely to develop this condition: Misusing other drugs. A family history of misusing drugs. History of mental illness. What are the signs or symptoms? Symptoms of this condition include: Craving cocaine, which can lead to: Using more cocaine than you want to, or using cocaine for longer than you want to. Being unable to slow down or stop your cocaine use. Needing more and more cocaine to get the same effects (building up a tolerance). Spending a lot of time getting cocaine, using it, or recovering from its effects. Having problems at work, at school, at home, or with relationships because of cocaine use. Giving up or cutting down on important life activities because of cocaine use. Using cocaine when it is dangerous, such as when driving a car. Continuing to use cocaine even though it has led to physical problems, such as: Poor nutrition. Nosebleeds. Chest pain. Continuing to use cocaine even though it is affecting your mental health. You may develop: Schizophrenia-like symptoms. You may think or act in an unusual way. Depression. Bipolar mood swings. Anxiety. Sleep problems. If you stop using cocaine, you may have symptoms of withdrawal, such as: Depression. Bad dreams or sleep problems. Tiredness (fatigue) or slowed thinking. Increased appetite. How is this diagnosed? This condition is diagnosed based on: A physical exam. Your history of cocaine  use. Your symptoms. This includes: How cocaine use affects your life. Changes in personality, behaviors, and mood. Health or mental health issues related to using cocaine. Blood or urine tests to screen for drugs. How is this treated? The first goal of treatment is to stop your use of cocaine. This must be done safely and may involve: Taking part in group and individual counseling from specially trained mental health providers. Staying at a residential treatment center for several days or weeks. Attending daily counseling sessions at a treatment center. Taking medicines as told by your health care provider that: Ease symptoms and prevent complications during withdrawal. Block cravings and block the good feeling that you get from using cocaine. Treat other mental health issues, such as depression or anxiety. Participating in a support group to share your experience with others who are going through the same thing. Recovery can be a long process. Many people who undergo treatment start using the drug again after stopping (relapse). If you relapse, it does not mean that treatment will not work. Follow these instructions at home: Medicines Take over-the-counter and prescription medicines only as told by your health care provider. Check with your health care provider before starting any new medicines, vitamins, herbs, or supplements. General instructions     Do not use any drugs or alcohol. Do not use any products that contain nicotine or tobacco. These products include  cigarettes, chewing tobacco, and vaping devices, such as e-cigarettes. If you need help quitting, ask your health care provider. Avoid people and activities that trigger your use of cocaine. Learn and practice techniques for managing stress. Have a plan for vulnerable moments. Get phone numbers of those who are willing to help and who are committed to your recovery. Attend support groups regularly for emotional support,  advice, and guidance. Keep all follow-up visits. This is important for recovery and includes continuing to work with therapists and support groups. Where to find more information [employer] on Drug Abuse: SkinCoat.nl Substance Abuse and Mental Health Services Administration: RockToxic.pl Narcotics Anonymous: DestructiveBlog.cz Contact a health care provider if: Your symptoms get worse. You use cocaine again. You cannot take your medicines as told. Get help right away if you have: Chest pain. Any symptoms of a stroke. BE FAST is an easy way to remember the main warning signs of a stroke: B - Balance. Signs are dizziness, sudden trouble walking, or loss of balance. E - Eyes. Signs are trouble seeing or a sudden change in vision. F - Face. Signs are sudden weakness or numbness of the face, or the face or eyelid drooping on one side. A - Arms. Signs are weakness or numbness in an arm. This happens suddenly and usually on one side of the body. S - Speech. Signs are sudden trouble speaking, slurred speech, or trouble understanding what people say. T - Time. Time to call emergency services. Write down what time symptoms started. Other signs of a stroke, such as: A sudden, severe headache with no known cause. Nausea or vomiting. Seizure. These symptoms may be an emergency. Get help right away. Call 911. Do not wait to see if the symptoms will go away. Do not drive yourself to the hospital. Also, get help right away if: You have serious thoughts about hurting yourself or others. Take one of these steps if you feel like you may hurt yourself or others, or have thoughts about taking your own life: Go to your nearest emergency room. Call 911. Call the National Suicide Prevention Lifeline at 251-322-2610 or 988. This is open 24 hours a day. Text the Crisis Text Line at 435-184-9258. Summary Cocaine has some medical uses, but these are rare. Cocaine is most often misused because of its effects. Many  people start using cocaine because it makes them feel good or helps them deal with their lives. Over time, they get addicted to it. Treatment for this condition is usually provided by specially trained mental health providers. This information is not intended to replace advice given to you by your health care provider. Make sure you discuss any questions you have with your health care provider.   Ashlay Altieri L, NP 05/15/2024, 11:43 AM

## 2024-05-15 NOTE — ED Notes (Signed)
 Patient discharged by provider.

## 2024-05-15 NOTE — Progress Notes (Signed)
   05/15/24 1047  [facility] Triage Screening (Walk-ins at [facility] only)  How Did You Hear About Us ? Legal System  What Is the Reason for Your Visit/Call Today? [name] 42Y male presents to [facility] accompanied by 2 family members. PT states he does not have any diagnosed mental health disorders. PT states he has been abusing cocaine for 7 years but this past year it has gotten out of hand. PT denies SI, HI, AVH and alcohol abuse. PT states he uses cocaine about 2x/week. PT is looking for a detox program.  How Long Has This Been Causing You Problems? > than 6 months  Have You Recently Had Any Thoughts About Hurting Yourself? No  Are You Planning to Commit Suicide/Harm Yourself At This time? No  Have you Recently Had Thoughts About Hurting Someone Sherral? No  Are You Planning To Harm Someone At This Time? No  Physical Abuse Denies  Verbal Abuse Denies  Sexual Abuse Denies  Exploitation of patient/patient's resources Denies  Self-Neglect Denies  Are you currently experiencing any auditory, visual or other hallucinations? No  Have You Used Any Alcohol or Drugs in the Past 24 Hours? Yes  What Did You Use and How Much? Cocaine, $40 worth (around 7-8PM)  Do you have any current medical co-morbidities that require immediate attention? No  Clinician description of patient physical appearance/behavior: PT is sniffing a lot, anxious, restless, cooperative  What Do You Feel Would Help You the Most Today? Alcohol or Drug Use Treatment  Determination of Need Urgent (48 hours)  Options For Referral [facility] Urgent Care;Facility-Based Crisis;Chemical Dependency Intensive Outpatient Therapy (CDIOP);Intensive Outpatient Therapy;Outpatient Therapy  Determination of Need filed? Yes

## 2024-05-21 ENCOUNTER — Ambulatory Visit (INDEPENDENT_AMBULATORY_CARE_PROVIDER_SITE_OTHER): Admitting: Licensed Clinical Social Worker

## 2024-05-21 ENCOUNTER — Encounter (HOSPITAL_COMMUNITY): Payer: Self-pay

## 2024-05-21 DIAGNOSIS — F1420 Cocaine dependence, uncomplicated: Secondary | ICD-10-CM

## 2024-05-21 DIAGNOSIS — F1211 Cannabis abuse, in remission: Secondary | ICD-10-CM
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
## 2024-05-22 ENCOUNTER — Telehealth (HOSPITAL_COMMUNITY): Payer: Self-pay

## 2024-05-22 NOTE — Telephone Encounter (Signed)
 Therapist calls [employer] company seeking pre-authorization for [phone] (CD-IOP). Therapist was told his insurance terminated on 05-15-24.  Pt communicated yesterday that he was let go from his job. [name] says he is applying for Medicaid but in the mean time, in order to attend CD IOP, he needs to be enrolled in [facility]. Therapist asks [name] if he can come in at 8:30 am on tomorrow to completed the Medicaid Tx Plan. He agrees.  Erik Simpler, MS, LMFT, LCAS

## 2024-05-23 ENCOUNTER — Ambulatory Visit (INDEPENDENT_AMBULATORY_CARE_PROVIDER_SITE_OTHER): Payer: Self-pay

## 2024-05-23 DIAGNOSIS — F1420 Cocaine dependence, uncomplicated: Secondary | ICD-10-CM

## 2024-05-23 DIAGNOSIS — F1211 Cannabis abuse, in remission: Secondary | ICD-10-CM

## 2024-05-23 NOTE — Progress Notes (Signed)
 Daily Group Progress Note   Program: CD IOP     Group Time: 9 a.m. to 12 p.m.      Type of Therapy: Process and Psychoeducational    Topic: The therapists check in with group members, assess for SI/HI/psychosis and overall level of functioning. The therapists inquire about sobriety date and number of community support meetings attended since last session.     Today, therapists discuss the following issues today, prompting discussion on the issues:  barriers to recovery,  how it is that longer someone is in recovery starts to notice difficulty in relating to people in active addiction, being smart in recovery, than than trying to be stronger (disease vs. Character disorder), role of sober support and social connectedness in recovery, how to implement the tenant of service in 12 step meeting. Therapists show PET scans of the brain when normal, in active use of cocaine and after 14 months of recovery and answered questions group members regarding addiction being a brain disease.   Summary: [name] presents today for his first session. He rates his depression and anxiety as a 0. [name] does not formally check in as this is his first session and gets to choose to pass on the first check in. [name] asks the question if someone should leave their spouse if their spouse uses. Therapists discuss how they do not recommend anyone divorcing their spouse. Therapists do discuss some options on how to approach their recovery when a spouse is in active addiction.   [name] shares that he was able to stop smoking marijuana cold [place]. He says the cocaine came after that. He says he thinks if he is around others who use that this will build his strength to not use by resisting the chemical   Progress Towards Goals: [name] reports his sobriety date as 05-14-24  UDS collected: No   Results: none  AA/NA attended?:  No   Sponsor?:  No   [name], MS, LMFT, LCAS [address], [place], [place], [facility],  LCAS 05/23/2024

## 2024-05-26 ENCOUNTER — Encounter (HOSPITAL_COMMUNITY): Payer: Self-pay | Admitting: Medical

## 2024-05-26 ENCOUNTER — Ambulatory Visit (INDEPENDENT_AMBULATORY_CARE_PROVIDER_SITE_OTHER): Payer: Self-pay | Admitting: Medical

## 2024-05-26 VITALS — BP 150/98 | HR 87 | Temp 98.1°F | Ht 74.0 in | Wt 221.0 lb

## 2024-05-26 DIAGNOSIS — F1211 Cannabis abuse, in remission: Secondary | ICD-10-CM | POA: Diagnosis not present

## 2024-05-26 DIAGNOSIS — I10 Essential (primary) hypertension: Secondary | ICD-10-CM | POA: Diagnosis not present

## 2024-05-26 DIAGNOSIS — N189 Chronic kidney disease, unspecified: Secondary | ICD-10-CM

## 2024-05-26 DIAGNOSIS — M1612 Unilateral primary osteoarthritis, left hip: Secondary | ICD-10-CM

## 2024-05-26 DIAGNOSIS — N529 Male erectile dysfunction, unspecified: Secondary | ICD-10-CM

## 2024-05-26 DIAGNOSIS — Z86711 Personal history of pulmonary embolism: Secondary | ICD-10-CM

## 2024-05-26 DIAGNOSIS — M1A9XX0 Chronic gout, unspecified, without tophus (tophi): Secondary | ICD-10-CM

## 2024-05-26 DIAGNOSIS — M47817 Spondylosis without myelopathy or radiculopathy, lumbosacral region: Secondary | ICD-10-CM

## 2024-05-26 DIAGNOSIS — F1420 Cocaine dependence, uncomplicated: Secondary | ICD-10-CM

## 2024-05-26 DIAGNOSIS — Z96641 Presence of right artificial hip joint: Secondary | ICD-10-CM

## 2024-05-26 NOTE — Progress Notes (Signed)
 Daily Group Progress Note   Program: CD IOP     Group Time: 9 a.m. to 12 p.m.      Type of Therapy: Process and Psychoeducational    Topic: The therapists check in with group members, assess for SI/HI/psychosis and overall level of functioning. The therapists inquire about sobriety date and number of community support meetings attended since last session.   The therapists focus primarily today on the topic of what a Sponsor is, what a Sponsor does, why a person needs a Marketing executive, and how to get one.   The therapists go into detail concerning how people work steps Financial controller and show members who to find virtual AA and NA meetings 24/7 in addition to how to find free AA and NA literature.    Summary: [name] presents today reporting that both his anxiety and depression are a 0. He describes his mood as happy and joyful.  At the beginning of group, he asks if he has to get a Sponsor admitting that he does not want one; however, it becomes apparent that he does not really know a lot about what a Sponsor is or does.  Additionally, [name] does not understand how to find meetings; however, by the end of group; he indicates that he now knows more about what a Sponsor does and that he plans on attending a virtual meeting today.   Progress Towards Goals: [name] reports no change in his sobriety date.  UDS collected: No Results: No  AA/NA attended?:  No   Sponsor?:  No   Erik Maier, MA, [place], [facility], LCAS [name], [place], LCAS 05/26/2024

## 2024-05-26 NOTE — Progress Notes (Addendum)
 Psychiatric Initial Adult Assessment   Patient Identification: Erik L Grindle Jr. MRN:  [id number] Date of Evaluation:  05/26/2024 Referral Source: [facility] Chief Complaint:   Chief Complaint  Patient presents with   Establish Care   Addiction Problem   Visit Diagnosis:    ICD-10-CM   1. Cocaine use disorder, severe, dependence (HCC)  F14.20     2. History of cannabis abuse  F12.11     3. Essential hypertension, benign  I10     4. History of pulmonary embolus (PE)  Z86.711     5. Chronic kidney disease, unspecified CKD stage  N18.9     6. Spondylosis of lumbosacral region, unspecified spinal osteoarthritis complication status  M47.817     7. Erectile dysfunction, unspecified erectile dysfunction type  N52.9     8. S/p revision of right total hip  Z96.641     9. Primary osteoarthritis of left hip  M16.12     10. Chronic gout without tophus, unspecified cause, unspecified site  [id number]       History of Present Illness:   On 05/15/24 pt presented to [facility] complaining: I need help with using cocaine. and requesting substance abuse treatment for cocaine use. He reported using cocaine intermittently for the past 7 years but his cocaine use has gotten out of hand and that he had been using more than usual. He could not think of a reason but on this day he showed up to work jacked up from using cocaine yesterday after he had called in sick.His boss started to send him home after observing him told him not to come back. 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  [facility] Discharge Disposition for Follow up and requesting substance abuse treatment for cocaine use. He reports using cocaine intermittently for the past 7 years. He states that his cocaine use has gotten out of hand and that he's been using more than usual.   Recommendations: Based on my evaluation the patient does not appear to have an emergency medical condition and can  be discharged with resources and follow up care in outpatient services for Substance Abuse Intensive Outpatient Program   You have been referred to the Chemical Dependency Intensive Outpatient Program: You are scheduled for an intake appointment on May 21, 2024 at 2:30 PM. If you have any questions or concerns please call [phone].  On 05/21/2024 pt met [name], MA, LCSW, [facility], LCAS for Comprehensive Clinical Assessment (CCA)  Recommendations for Services/Supports/Treatments: Recommendations for Services/Supports/Treatments Recommendations For Services/Supports/Treatments: CD-IOP Intensive Chemical Dependency Program, Individual Therapy, Medication Management  In speaking with him today,he has never felt he was dependent on cocaine except to enhance his sexuality/sexual encounters. He also does not feel he has an addiction to sex.  Associated Signs/Symptoms: Substance use Disorder (SUD) Substance Use Disorder (SUD)  Checklist Symptoms of Substance Use: Persistent desire or unsuccessful efforts to cut down or control use, Recurrent use that results in a failure to fulfill major role obligations (work, school, home), Presence of craving or strong urge to use, Substance(s) often taken in larger amounts or over longer times than was intended   ASAM's:  Six Dimensions of Multidimensional Assessment Dimension 1:  Acute Intoxication and/or Withdrawal Potential:   Dimension 1:  Description of individual's past and current experiences of substance use and withdrawal: mild to moderate intoxication that interferes with daily functioning and resulted in recent job loss  Dimension 2:  [employer]  Conditions and Complications:   Dimension 2:  Description of patient's biomedical conditions and  complications: no physical pain or discomfort; has HTN and occasional gout  Dimension 3:  Emotional, Behavioral, or Cognitive Conditions and Complications:  Dimension 3:  Description of emotional,  behavioral, or cognitive conditions and complications: denies any depression or anxiety  Dimension 4:  Readiness to Change:  Dimension 4:  Description of Readiness to Change criteria: willing to engage in treatment  Dimension 5:  Relapse, Continued use, or Continued Problem Potential:  Dimension 5:  Relapse, continued use, or continued problem potential critiera description: does not really have relapse skills as evidenced by being able to make it typically three months before resuming use with longest lifetime being 6 months since late teens  Dimension 6:  Recovery/Living Environment:  Dimension 6:  Recovery/Iiving environment criteria description: lives with ex-wife with whom he has resumed a relationship saying she is supportive as is his father and his daughter  ASAM Severity Score: ASAM's Severity Rating Score: 3  ASAM Recommended Level of Treatment:     Depression Symptoms:   Advertising copywriter from 05/21/2024 in [facility] Office Visit from [facility]/12/2023 in [facility] [place] Office Visit from 01/17/2023 in [facility] [place] Office Visit from 06/03/2019 in [facility] [place] Office Visit from 01/05/2017 in [facility] [place]. [facility]  Little interest or pleasure in doing things 0 3 0 0 0  Feeling down, depressed, or hopeless (PHQ Adolescent also includes...irritable) 0 0 0 0 0  PHQ-2 Total Score 0 3 0 0 0  Trouble falling or staying asleep, or sleeping too much -- 0 -- -- --  Feeling tired or having little energy -- 0 -- -- --  Poor appetite or overeating (PHQ Adolescent also includes...weight loss) -- 0 -- -- --  Feeling bad about yourself - or that you are a failure or have let yourself or your family down -- 0 -- -- --  Trouble concentrating on things, such as reading the newspaper or watching television (PHQ  Adolescent also includes...like school work) -- 0 -- -- --  Moving or speaking so slowly that other people could have noticed. Or the opposite - being so fidgety or restless that you have been moving around a lot more than usual -- 0 -- -- --  Thoughts that you would be better off dead, or of hurting yourself in some way -- 0 -- -- --  PHQ-9 Total Score -- 3 -- -- --     (Hypo) Manic Symptoms:   Anxiety Symptoms:   GAD-7  Flowsheet Row Counselor from 05/21/2024 in [facility] Office Visit from 06/12/2023 in [facility] [place]  1. Feeling Nervous, Anxious, or on Edge 0 0  2. Not Being Able to Stop or Control Worrying 0 0  3. Worrying Too Much About Different Things 0 0  4. Trouble Relaxing 0 0  5. Being So Restless it's Hard To Sit Still 0 0  6. Becoming Easily Annoyed or Irritable 0 0  7. Feeling Afraid As If Something Awful Might Happen 0 0  Total GAD-7 Score 0 0  Difficulty At Work, Home, or Getting  Along With Others? Not difficult at all Not difficult at all  Psychotic Symptoms:   PTSD Symptoms:   Past Psychiatric  History:  No past psychiatric history   Previous Psychotropic Medications: No   Substance Abuse History in the last 12 months:  Yes.   CCA Substance Use Alcohol/Drug Use: Alcohol / Drug Use Pain Medications: None Prescriptions: Norvasc  and Colchicine  History of alcohol / drug use?: Yes Longest period of sobriety (when/how long): 6 months Substance #1 Name of Substance 1: Marijuana 1 - Age of First Use: 20 or 21 1 - Frequency: three times per week 1 - Duration: ages [age] to age 41 1 - Last Use / Amount: 1996 1 - Method of Aquiring: illicit 1- Route of Use: smoking Substance #2 Name of Substance 2: Cocaine 2 - Age of First Use: 20 or 21 2 - Amount (size/oz): $80 2 - Frequency: two times per week 2 - Duration: using almost continuously since age 37 or 55 with the exception of when he was smoking pot  and 3 months or 6 months in which he would stop but not quit 2 - Last Use / Amount: 05/14/24/$40 woth 2 - Method of Aquiring: illicit 2 - Route of Substance Use: snorting      Consequences of Substance Abuse: Negative Consequences of Use: Work / Programmer, multimedia, Personal relationships Withdrawal Symptoms: None Blackouts:denied  DT's: No Withdrawal Symptoms:  Denies   Past Medical History:  Past Medical History:  Diagnosis Date   Hypertension Gout DJD PE CKD     Past Surgical History:  Procedure Laterality Date   ENDOVENOUS ABLATION SAPHENOUS VEIN W/ LASER  01/08/2013   left leg for chronic superficial venous indufficiency, by Dr. Patty    EXCISION MASS NECK N/A 04/17/2024   Procedure: EXCISION, MASS, NECK;  Surgeon: Polly Cordella LABOR, MD;  Location: [facility] OR;  Service: General;  Laterality: N/A; [place]   NECK SURGERY  2023   TOTAL HIP ARTHROPLASTY Right 2021    Family Psychiatric History:  He says that his oldest brother used crack cocaine and that he has a sister who smokes weed.   Family History:  Family History  Problem Relation Age of Onset   Cancer Mother    Hypertension Father    Cancer Sister    Hypertension Brother     Social History:   Social History   Socioeconomic History   Marital status: Divorced    Spouse name:    Number of children: Not on file   Years of education: Not on file   Highest education level: Not on file  Occupational History   Unemployed Sept 2025 let go  Tobacco Use   Smoking status: Never   Smokeless tobacco: Never  Vaping Use   Vaping status: Never Used  Substance and Sexual Activity   Alcohol use:     Comment: socially   Drug use:    Sexual activity: Yes  Other Topics Concern   See additional Social hx  Social History Narrative   He says that he had his own place until March of this year when he moved in with his ex-wife and renewed their relationship.  His ex-wife is supportive of his recovery as is his father and his  40 year old daughter.    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 - Transportation    Lack of Transportation (Medical): No    Lack of Transportation (Non-Medical): No  Physical Activity: Exercise/Diet: Do You Exercise?: sometimes goes bowling.  Have You Gained or Lost A Significant Amount of Weight in the Past Six Months?: No Do You Follow a Special Diet?: No Do You Have Any Trouble Sleeping?: No   Stress  Stressors:  Work    Coping Ability:  Deficient supports      Social Connections:Supports: He says that his ex-wife is supportive of his recovery as is his father and his 86 year old daughter.     Additional Social History:  Religion: Religion/Spirituality Are You A Religious Person?: Yes What is Your Religious Affiliation?: Baptist   Leisure/Recreation: Leisure / Recreation Do You Have Hobbies?: Yes   Allergies:  No Known Allergies  Metabolic Disorder Labs: Lab Results  Component Value Date   HGBA1C 5.7 11/01/2022   No results found for: PROLACTIN Lab Results  Component Value Date   CHOL 158 02/18/2024   TRIG 74.0 02/18/2024   HDL 53.80 02/18/2024   CHOLHDL 3 02/18/2024   VLDL 14.8 02/18/2024   LDLCALC 90 02/18/2024   LDLCALC 77 11/01/2022   Lab Results  Component Value Date   TSH 0.836 12/02/2014    Therapeutic Level Labs:NA   Current Medications: Current Outpatient Medications  Medication Sig Dispense Refill   amLODipine  (NORVASC ) 10 MG tablet  Take 1 tablet (10 mg total) by mouth daily. 90 tablet 1   colchicine  0.6 MG tablet Take 0.6 mg by mouth daily as needed (gout flares).     folic acid  (FOLVITE ) 1 MG tablet Take 1 tablet by mouth once daily 90 tablet 0   sildenafil  (VIAGRA ) 100 MG tablet Take 1 tablet (100 mg total) by mouth daily as needed for erectile dysfunction. 30 tablet 5   No current facility-administered medications for this visit.    Musculoskeletal: Strength & Muscle Tone:  Gait & Station:  Patient leans:   Psychiatric Specialty Exam: Review of Systems  Constitutional:  Positive for activity change. Negative for appetite change, diaphoresis, fatigue, fever and unexpected weight change.  HENT:  Negative for congestion, dental problem, drooling, ear discharge, ear pain, facial swelling, hearing loss, mouth sores, nosebleeds, postnasal drip, rhinorrhea, sinus pressure, sinus pain, sneezing, sore throat, tinnitus, trouble swallowing and voice change.   Eyes:  Positive for visual disturbance (Glasses). Negative for photophobia, pain, discharge, redness and itching.  Respiratory:  Negative for apnea, cough, choking, chest tightness, shortness of breath, wheezing and stridor.   Cardiovascular:  Negative for chest pain, palpitations and leg swelling.  Gastrointestinal:  Negative for abdominal distention, abdominal pain, anal bleeding, blood in stool, constipation, diarrhea, nausea, rectal pain and vomiting.  Endocrine: Negative for cold intolerance, heat intolerance, polydipsia, polyphagia and polyuria.  Genitourinary:  Negative for decreased urine volume, difficulty urinating, dysuria, enuresis, flank pain, frequency, genital sores, hematuria, penile discharge, penile pain, penile swelling, scrotal swelling, testicular pain and urgency.  Musculoskeletal:  Positive for arthralgias and back pain. Negative for gait problem, joint swelling, myalgias, neck pain and neck stiffness.  Skin:  Negative for color change, pallor, rash  and wound.  Allergic/Immunologic: Negative for environmental allergies, food allergies and immunocompromised state.  Neurological:  Negative for dizziness, tremors, seizures, syncope, facial asymmetry, speech difficulty, weakness, light-headedness, numbness and headaches.  Hematological:  Negative for adenopathy. Does not bruise/bleed easily.  Psychiatric/Behavioral:  Positive for behavioral problems. Negative for agitation, confusion, decreased concentration, dysphoric mood, hallucinations, self-injury, sleep disturbance and suicidal ideas. The patient is not nervous/anxious and is not hyperactive.     Blood pressure (!) 150/98, pulse 87, temperature 98.1 F (36.7 C), height 6' 2 (1.88 m), weight 221 lb (100.2 kg).Body mass index is 28.37 kg/m.  General Appearance:   Eye Contact:    Speech:    Volume:    Mood:    Affect:    Thought Process:    Orientation:    Thought Content:    Suicidal Thoughts:    Homicidal Thoughts:    Memory:    Judgement:    Insight:    Psychomotor Activity:   Concentration:    Recall:    Fund of Knowledge:  Language:   Akathisia:    Handed:    AIMS (if indicated):    Assets:    ADL's:    Cognition:   Sleep:     Screenings: GAD-7    Advertising copywriter from 05/21/2024 in [facility] Office Visit from 06/12/2023 in [facility] [place]  Total GAD-7 Score 0 0   PHQ2-9    Flowsheet Row Counselor from 05/21/2024 in [facility] Office Visit from 06/12/2023 in [facility] [place] Office Visit from 01/17/2023 in [facility] [place] Office Visit from 06/03/2019 in [facility] [place]. [facility] Office Visit from 01/05/2017 in [facility] [place]. [facility]  PHQ-2 Total Score 0 3 0 0 0  PHQ-9 Total Score -- 3 -- -- --   Flowsheet Row Counselor  from 05/21/2024 in [facility] ED from 05/15/2024 in [facility] ED from 03/01/2023 in [facility]  C-SSRS RISK CATEGORY No Risk No Risk No Risk    Assessment  SUD list indicates  severe cocaine use disorder Pt unaware of above/complete denial ? Of Process addiction (Sex)  and Plan:  Treatment Plan/Recommendations: Plan of Care: SUDs/Core issues [facility] CDIOP see Counselor's individualized treatment plan  Laboratory:UDS per protocol  Psychotherapy: CD IOP Group,Individual and Family  Medications: See list  Routine PRN Medications: None from IOP  Consultations: NA Pt will take Sex addiction screening test and DAST 10 to further assess his level of dependence  Safety Concerns: RISK ASSESSMENT -Negative  Other:  Anatomy and Biology of addiction/Addicted Brain reviewed with [employer] (Pictures of Anatomy and Function of Human brain along with  Pictures of Pet Scans Of Addicted Brains and You Tube video(Baclofen reduces cravings)    Carlin Emmer, PA-C 10/6/20255:05 PM

## 2024-05-28 ENCOUNTER — Telehealth (HOSPITAL_COMMUNITY): Payer: Self-pay

## 2024-05-28 ENCOUNTER — Ambulatory Visit (HOSPITAL_COMMUNITY): Payer: Self-pay

## 2024-05-28 NOTE — Telephone Encounter (Signed)
 [name] calls and leaves a message that he will not be in group today but he will see us  on Friday.  [name], MS, LMFT, LCAS

## 2024-05-30 ENCOUNTER — Ambulatory Visit (INDEPENDENT_AMBULATORY_CARE_PROVIDER_SITE_OTHER): Payer: Self-pay | Admitting: Licensed Clinical Social Worker

## 2024-05-30 DIAGNOSIS — F1211 Cannabis abuse, in remission: Secondary | ICD-10-CM

## 2024-05-30 DIAGNOSIS — F1420 Cocaine dependence, uncomplicated: Secondary | ICD-10-CM | POA: Diagnosis not present

## 2024-05-30 NOTE — Progress Notes (Signed)
 Daily Group Progress Note   Program: CD IOP     Group Time: 9 a.m. to 12 p.m.      Type of Therapy: Process and Psychoeducational    Topic: The therapist checks in with group members, assesses for SI/HI/psychosis and overall level of functioning. The therapist inquires about sobriety date and number of community support meetings attended since last session.   The therapist facilitates group discussions on a number of topics such as self will and the need to prioritize one's addiction recovery given that this disease is one that can prove to be fatal for many.  The therapist talks about the importance of scheduling and it deserves that when the person uses the word try that here she is typically not likely to end up following through.  The therapist talks about the fact that using drugs and alcohol when a person is wait-and-see age or an adolescent will hardwire this pattern in the person's brain indefinitely with stress being a factor that can activate it even though it has been dormant for some time.  The therapist makes the observation that sometimes what an individual may be going through could end up being an inspiration to others.  Lastly, the therapist talks about the history of Alcoholics Anonymous and Narcotics Anonymous explaining that everything they do in the program has a reason and that people must trust that there is a method to their madness.   Summary: [name] says today rating his depression and anxiety as a 0.  He attended a virtual NA meeting which was an open discussion meeting and liked the fact that the people in attendance were open and truthful.  He also read some of the material in the [employer] but says that he does not have a sponsor yet.  [name] explains that he went home and planned on what meetings that he was going to attend and got some assistance from his wife to set up his virtual meeting as he is not very tech inclined.  He says that he been set an alarm  20 or 30 minutes in advance to remind him of the meeting so that he would not miss it.  When another group member talks about his difficulty in mowing his grass because of his health concerns, [name] offers to mow his ask for him in the future if he needs help.  [name] is very active in all of today's group discussions and appropriately provides feedback to other members.  He says that his biggest take away from today's group he is to not give up and to take things 1 day at a time.  He tells other group members that whenever he deals with adversity that the thing that keeps him going is to always remind himself that there is someone else who has it worse that he does.    Progress Towards Goals: [name] reports no change in his sobriety date.   UDS collected: Yes Results: Yes   AA/NA attended?:  Yes   Sponsor?:  No     Elsie Maier, [place], [place], [facility], LCAS 05/30/2024

## 2024-06-02 ENCOUNTER — Ambulatory Visit (INDEPENDENT_AMBULATORY_CARE_PROVIDER_SITE_OTHER): Payer: Self-pay | Admitting: Licensed Clinical Social Worker

## 2024-06-02 DIAGNOSIS — M1A9XX0 Chronic gout, unspecified, without tophus (tophi): Secondary | ICD-10-CM

## 2024-06-02 DIAGNOSIS — I10 Essential (primary) hypertension: Secondary | ICD-10-CM

## 2024-06-02 DIAGNOSIS — F1211 Cannabis abuse, in remission: Secondary | ICD-10-CM

## 2024-06-02 DIAGNOSIS — N189 Chronic kidney disease, unspecified: Secondary | ICD-10-CM

## 2024-06-02 DIAGNOSIS — F1420 Cocaine dependence, uncomplicated: Secondary | ICD-10-CM | POA: Diagnosis not present

## 2024-06-02 DIAGNOSIS — N529 Male erectile dysfunction, unspecified: Secondary | ICD-10-CM

## 2024-06-02 DIAGNOSIS — Z86711 Personal history of pulmonary embolism: Secondary | ICD-10-CM

## 2024-06-02 DIAGNOSIS — Z96641 Presence of right artificial hip joint: Secondary | ICD-10-CM

## 2024-06-02 DIAGNOSIS — M1612 Unilateral primary osteoarthritis, left hip: Secondary | ICD-10-CM

## 2024-06-02 DIAGNOSIS — M47817 Spondylosis without myelopathy or radiculopathy, lumbosacral region: Secondary | ICD-10-CM

## 2024-06-02 NOTE — Progress Notes (Signed)
 Daily Group Progress Note   Program: CD IOP     Group Time: 9 a.m. to 12 p.m.      Type of Therapy: Process and Psychoeducational    Topic: The therapists check in with group members, assess for SI/HI/psychosis and overall level of functioning. The therapists inquire about sobriety date and number of community support meetings attended since last session.   The therapists introduce a new group member and read the NA Just for Today reading from 05/31/24 on the [employer] Day Wonder asking group members to discuss what their takeaway is from this and the biological reason that people would be bored in early recovery. The therapists suggest that comparing one's self to others or trying to be special or unique are both products of ego and likely to end only in misery. The therapists suggest that many people like attending meetings and taking UDS as the accountability helps to keep them on track. The therapists cover the importance of learning to be comfortable with being uncomfortable and discuss acceptance and commitment therapy normalizing some amount of anxiety, depression, and anger as a part of life. The therapists talk about the concept of self-forgiveness as it related to Steps 4 and 5 reminding people that they are not who they are in their disease. The therapists explain that people who beat up on themselves for actions when using who claim to understand that addiction is a disease still are not fully grasping that it is a disease. The therapists observe that people do not see the actions of a family member with Alzheimer's or Huntington's Disease as defining who the person is when healthy. Lastly, the therapists discuss the Abstinence Stage of Recovery and the tasks involved in this stage as well as the difference between acute withdrawal and pot-acute withdrawal syndrome.     Summary: Clemons presents today rating his depression and anxiety both as a 0.  Another group member gives Murlin a copy of his  old Narcotics Anonymous basic text.  Leith says that he has the same sobriety date and initially says that he plans on attending 3 virtual meetings per week but then revises this to 2 meetings.  He says that he needs to find an in person meetings so as to get a sponsor.  In talking about another group member who took a leave of absence so as to not lose her job of 17 years, Ossiel informs her that he also worked at a place for 17 years but did not get treatment or seek a leave of absence so consequently ended up losing his job.  Keondrick says that he is doing better as of late as he is not worrying about the wrong things.     Progress Towards Goals: Candace reports no change in his sobriety date.   UDS collected: No Results: No   AA/NA attended?:  Yes   Sponsor?:  No   Elsie Maier, MA, [place], [facility], LCAS [name], MS, LMFT, LCAS   06/02/2024

## 2024-06-04 ENCOUNTER — Ambulatory Visit (INDEPENDENT_AMBULATORY_CARE_PROVIDER_SITE_OTHER): Payer: Self-pay

## 2024-06-04 DIAGNOSIS — F1211 Cannabis abuse, in remission: Secondary | ICD-10-CM

## 2024-06-04 DIAGNOSIS — F1420 Cocaine dependence, uncomplicated: Secondary | ICD-10-CM | POA: Diagnosis not present

## 2024-06-04 NOTE — Addendum Note (Signed)
 Addended by: LEILA CARLIN BRAVO on: 06/04/2024 11:37 AM   Modules accepted: Level of Service

## 2024-06-04 NOTE — Progress Notes (Signed)
 [facility] Follow-up Outpatient CDIOP Date: 06/02/2024  Admission Date:05/26/2024  Sobriety date:05/14/2024  Subjective: I sse that now (progression of cocaine use with increasing LOC)  HPI : CD IOP Provider FU This is Korion's initial CDIOP FU ,Seiji is now 19 days out from his last use. He was initially reluctant to see a problem with his cocaine use associated with sexual activity. He screened negative on Sexual Addiction Assessment Dru Conradi) totally and in all dimensions. His DAST 10 score however indicated he is definitely at risk and  on verge of total loss of control. When informed of this he agrees completely and has become convinced he needs help.  He denies cravings that can't be managed behaviorally and he is starting to participate in NA   Counselor's report: Summary: Latonya presents today rating his depression and anxiety both as a 0.  Another group member gives Bernadette a copy of his old Narcotics Anonymous basic text.  Esgar says that he has the same sobriety date and initially says that he plans on attending 3 virtual meetings per week but then revises this to 2 meetings.  He says that he needs to find an in person meetings so as to get a sponsor.   In talking about another group member who took a leave of absence so as to not lose her job of 17 years, Rohan informs her that he also worked at a place for 17 years but did not get treatment or seek a leave of absence so consequently ended up losing his job.   Leovanni says that he is doing better as of late as he is not worrying about the wrong things.  Review of Systems: Psychiatric: Agitation: See Counselor report Hallucination: No Depressed Mood: see Counselor reports Insomnia: No Hypersomnia: No Altered Concentration: No Feels Worthless: No Grandiose Ideas: No Belief In Special Powers: No New/Increased Substance Abuse: No Compulsions: In early withdrawal  Neurologic: Headache: No Seizure:  No Paresthesias: No  Current Medications: Your Medication List amLODipine  10 MG tablet Commonly known as: NORVASC  Take 1 tablet (10 mg total) by mouth daily.  colchicine  0.6 MG tablet Take 0.6 mg by mouth daily as needed (gout flares).  folic acid  1 MG tablet Commonly known as: FOLVITE  Take 1 tablet by mouth once daily  sildenafil  100 MG tablet Commonly known as: Viagra  Take 1 tablet (100 mg total) by mouth daily as needed for erectile dysfunction.    Mental Status Examination  Appearance:Casual neat Alert: Yes Attention: good  Cooperative: Yes Eye Contact: Good Speech: Clear and coherent, rate WNL Psychomotor Activity: Normal Memory:Intact Concentration/Attention: Normal/intact Oriented: person, place, time/date and situation Mood: Euthymic Affect: Appropriate and Congruent Thought Processes and Associations: Coherent and Intact Fund of Knowledge: Good Thought Content: WDL Insight:Opened up to condition Judgement:Marked improvement  LID:Rozjm  PDMP: 04/17/2024 04/17/2024  1 Oxycodone  Hcl (Ir) 5 Mg Tablet   Diagnosis:  Cocaine use disorder, severe, dependence (HCC) History of cannabis abuse Essential hypertension, benign History of pulmonary embolus (PE) Chronic kidney disease, unspecified CKD stage Spondylosis of lumbosacral region, unspecified spinal osteoarthritis complication status Erectile dysfunction, unspecified erectile dysfunction type S/p revision of right total hip Primary osteoarthritis of left hip Chronic gout without tophus, unspecified cause, unspecified site  Assessment: He has seen the light and it is a train called Cocaine coming at him down the tunnel ! Hopefully it will take hold  Treatment Plan:Per admission and Counselors FU 2 weeks Carlin Emmer, PA-CPatient ID: Lon L Barrero Jr., male  DOB: [birth year], 58 y.o.   MRN: [id number]

## 2024-06-04 NOTE — Progress Notes (Addendum)
 Daily Group Progress Note   Program: CD IOP     Group Time: 9 a.m. to 12 p.m.      Type of Therapy: Process and Psychoeducational    Topic: The therapists check in with group members, assess for SI/HI/psychosis and overall level of functioning. The therapists inquire about sobriety date and number of community support meetings attended since last session.   Therapists discuss the following issues today: how the disease of addition lies trying to convince the addict that they can use and it will not hurt them,how to recognize and hear the disease, importance of putting roadblocks in the way of using, how addiction makes a rationalization for one's use, best course of action involves living honestly. Hanging out with people that are conducive to our recovery.   Summary: [name] presents today rating his depression and anxiety both as a 0.  He reports the same sobriety date. He reports he has not been to a meeting since last group but plans to do so tomorrow. He does not have a sponsor. He says he will work on that this week.  He says he wants to do an in person meeting.  He identifies his emotions as good and excited. He says he feels good and is excited for the virtual meetings. He says he has not done any readings in the Big Book.       Progress Towards Goals: [name] reports no change in his sobriety date.   UDS collected: yes Results: Rapid UDS was negative   AA/NA attended?:  no   Sponsor?:  No    [name], MS, LMFT, LCAS [address], [place], [place], [facility], LCAS   06/04/2024

## 2024-06-06 ENCOUNTER — Ambulatory Visit (INDEPENDENT_AMBULATORY_CARE_PROVIDER_SITE_OTHER): Payer: Self-pay

## 2024-06-06 DIAGNOSIS — F1420 Cocaine dependence, uncomplicated: Secondary | ICD-10-CM

## 2024-06-06 NOTE — Progress Notes (Addendum)
 Daily Group Progress Note   Program: CD IOP     Group Time: 9 a.m. to 10:20 p.m.      Type of Therapy: Process and Psychoeducational    Topic: The therapists check in with group members, assess for SI/HI/psychosis and overall level of functioning. The therapists inquire about sobriety date and number of community support meetings attended since last session.  The therapists discuss the follow issues today: Introduced new group member. Learning to listen to the voice of addiction and how to respond to it, the need to change people, places and things associated with use, Share statistics that American's consumer 80% of opioids and 70% of suicides involve one or more substances.      Summary: [name] presents today rating his depression and anxiety both as a 0.  He reports the same sobriety date.  He has attended a meeting since last group. He does not have a sponor. [name] says he has to leave early from group.       Progress Towards Goals: [name] reports no change in his sobriety date.   UDS collected: yes Results: none   AA/NA attended?:  no   Sponsor?:  No    [name], MS, LMFT, LCAS [address] [place], [place], [facility], LCAS   06/06/2024

## 2024-06-09 ENCOUNTER — Ambulatory Visit (HOSPITAL_COMMUNITY): Payer: Self-pay | Admitting: Licensed Clinical Social Worker

## 2024-06-09 DIAGNOSIS — Z5329 Procedure and treatment not carried out because of patient's decision for other reasons: Secondary | ICD-10-CM

## 2024-06-09 NOTE — Progress Notes (Signed)
 [name] presents today for SA IOP saying that he feels like he will not be able to talk much as his throat is sore and admits that he might have a fever but did not want to not come and give the therapists the impression he was not motivated saying that he wants to complete the program the right way.  The therapists send [name] home reminding him to practice self-care which is part of recovery.  [name], MA, LCSW, [facility], LCAS 06/09/2024

## 2024-06-11 ENCOUNTER — Encounter (HOSPITAL_COMMUNITY): Payer: Self-pay | Admitting: Licensed Clinical Social Worker

## 2024-06-11 ENCOUNTER — Ambulatory Visit (INDEPENDENT_AMBULATORY_CARE_PROVIDER_SITE_OTHER): Payer: Self-pay | Admitting: Licensed Clinical Social Worker

## 2024-06-11 DIAGNOSIS — F1211 Cannabis abuse, in remission: Secondary | ICD-10-CM

## 2024-06-11 DIAGNOSIS — F1420 Cocaine dependence, uncomplicated: Secondary | ICD-10-CM

## 2024-06-11 NOTE — Progress Notes (Signed)
 Daily Group Progress Note   Program: CD IOP     Group Time: 9 a.m. to 12 p.m.      Type of Therapy: Process and Psychoeducational    Topic: The therapists check in with group members, assess for SI/HI/psychosis and overall level of functioning. The therapists inquire about sobriety date and number of community support meetings attended since last session.   The therapists focused primarily today on external triggers having group members complete the external trigger exercise from the matrix model.  The therapists emphasize the fact that if they hope to establish long-term recovery that they must be willing to change their lives and not interact with persons and active addiction in their personal life in any way, shape, or form.  The therapists also normalize boredom as being part of early recovery as a result of postacute withdrawal syndrome.   Summary: Trayshawn presents today rating both his depression and anxiety as a 0.  He says that he attended to virtual meetings yesterday and describes his mood as good and better and better each day.  Tarvaris says that he wishes that he did not have to go back to work and says that his ex-wife is now pushing him to start doing more things for himself suggesting that she wants him to start taking more responsibility for his own recovery.  In talking about her, he realizes that he is grateful for the things that she has done for him and says that he plans on giving her a big hug when he gets home.  He says that it is still his goal to attend in person meetings and get a sponsor.  At the conclusion today, he says that he learned a lot from doing the exercise on triggers.     Progress Towards Goals: Ayvion reports no change in his sobriety date.  UDS collected: No Results: No   AA/NA attended?:  Yes   Sponsor?:  No   Elsie Maier, MA, [place], [facility], LCAS [name], MS, LMFT, LCAS 06/11/2024

## 2024-06-13 ENCOUNTER — Ambulatory Visit (INDEPENDENT_AMBULATORY_CARE_PROVIDER_SITE_OTHER): Payer: Self-pay | Admitting: Licensed Clinical Social Worker

## 2024-06-13 DIAGNOSIS — F1211 Cannabis abuse, in remission: Secondary | ICD-10-CM

## 2024-06-13 DIAGNOSIS — F1420 Cocaine dependence, uncomplicated: Secondary | ICD-10-CM | POA: Diagnosis not present

## 2024-06-13 NOTE — Progress Notes (Signed)
 Daily Group Progress Note   Program: CD IOP     Group Time: 9 a.m. to 12 p.m.      Type of Therapy: Process and Psychoeducational    Topic: The therapists check in with group members, assess for SI/HI/psychosis and overall level of functioning. The therapists inquire about sobriety date and number of community support meetings attended since last session.   The therapists have group members share and discuss their answers from their external trigger questionnaire. The therapists facilitate a group discussion on war stories. They observe that people in active addiction often use humor as a defense against recognizing the consequences of their actions while using.    Summary: [name] presents today rating both his depression and anxiety as a 0.  He says that he would use 100% of the time in the past when home alone; however, he would never use with friends, at the movies, or on vacation.   He has attended two virtual meetings yesterday but still no in person meetings. He says that he plans on getting a Sponsor next week. When the therapist questions the reason that [name] has yet to go to an in person meeting in spite of saying on several occasions that he was going to do so, he eventually concludes that he does not know the reason.   [name] describes his mood as good and happy.      Progress Towards Goals: [name] reports no change in his sobriety date.  UDS collected: No Results: No   AA/NA attended?:  Yes   Sponsor?:  No   Elsie Maier, MA, [place], [facility], LCAS [name], MS, LMFT, LCAS 06/13/2024

## 2024-06-16 ENCOUNTER — Ambulatory Visit (INDEPENDENT_AMBULATORY_CARE_PROVIDER_SITE_OTHER): Payer: Self-pay | Admitting: Licensed Clinical Social Worker

## 2024-06-16 DIAGNOSIS — F1420 Cocaine dependence, uncomplicated: Secondary | ICD-10-CM | POA: Diagnosis not present

## 2024-06-16 DIAGNOSIS — F1211 Cannabis abuse, in remission: Secondary | ICD-10-CM

## 2024-06-16 NOTE — Progress Notes (Signed)
 Daily Group Progress Note   Program: CD IOP     Group Time: 9 a.m. to 12 p.m.      Type of Therapy: Process and Psychoeducational    Topic: The therapists check in with group members, assess for SI/HI/psychosis and overall level of functioning. The therapists inquire about sobriety date and number of community support meetings attended since last session.   The therapists answer group members' questions about the MAT drug, baclofen, and show a short video explaining the impact of this medication on the brain. The therapists emphasize that MAT is another tool in a person's recovery; however, a person on MAT still needs to avoid people, places, and things associated with drugs and alcohol.  The therapists facilitate a discussion on limit setting and co-dependency using one group member's situation with her family in particular to illustrate these concepts.    Summary: [name] presents today rating both his depression and anxiety as a 0.  He says that his mood is good.  He says that he plans on attending a live meeting tomorrow or possibly today. He says that his wife has encouraged him to start doing some of the readings in the Basic Text.  [name] says that he also wants to get a Marketing executive. He talks about the fact that his body feels better now that he is no longer using saying that he is not waking up with pain all over his body.   He is active in today's group discussion and points out that another group member's mother attending a closed meeting and pretending to be a person in recovery not only overstepped her bounds with this person but was an invasion of privacy for the other women in the meeting.      Progress Towards Goals: [name] reports no change in his sobriety date.  UDS collected: Yes Results: Yes, negative for drugs   AA/NA attended?:  Yes   Sponsor?:  No   Elsie Maier, MA, [place], [facility], LCAS [name], MS, LMFT, LCAS 06/16/2024

## 2024-06-18 ENCOUNTER — Telehealth (HOSPITAL_COMMUNITY): Payer: Self-pay

## 2024-06-18 ENCOUNTER — Ambulatory Visit (HOSPITAL_COMMUNITY): Payer: Self-pay

## 2024-06-18 NOTE — Telephone Encounter (Signed)
 [name] calls in at 7:06 am this morning and says he will not be in group and he would see us  on Friday. Therapist alerted front desk.  [name], MS, LMFT, LCAS

## 2024-06-20 ENCOUNTER — Ambulatory Visit (HOSPITAL_COMMUNITY): Payer: Self-pay | Admitting: Licensed Clinical Social Worker

## 2024-06-20 DIAGNOSIS — F1420 Cocaine dependence, uncomplicated: Secondary | ICD-10-CM | POA: Diagnosis not present

## 2024-06-20 DIAGNOSIS — F1211 Cannabis abuse, in remission: Secondary | ICD-10-CM

## 2024-06-20 NOTE — Progress Notes (Signed)
 Daily Group Progress Note   Program: CD IOP     Group Time: 9 a.m. to 12 p.m.      Type of Therapy: Process and Psychoeducational    Topic: The therapists check in with group members, assess for SI/HI/psychosis and overall level of functioning. The therapists inquire about sobriety date and number of community support meetings attended since last session.   The therapists welcoming new member to group.  The therapists explain what working the fourth step entails in addition to discussing what a sponsor does and how to find one.  The therapist also educate the new members on what the big book and basic text of NA are and how to be able to listen to the audio format of these texts in addition to reading them.  The therapists facilitate a group discussion on the importance of the first step and what powerlessness from addiction looks like.   Summary: Akili presents today rating both his depression and anxiety as a 0.  He reports no change in his sobriety date and says that he has not had any desire or thoughts about using cocaine since he has started this program.  He says that he has fallen off going to meetings this week as his wife is currently in the hospital but plans on resuming meetings and still wants to attend an in person meeting.  Heather says that he realized that being home alone and having money would have been a trigger for him but is proud of the fact that he did not even think about using.  He describes his mood as good but tired.  Kymari spends a lot of time asking questions of another group member to understand what it was that caused this particular group member to repeatedly return to substance use after reportedly having years of sobriety each time.  Ashland questions how it was that he could stop using marijuana and his wife got pregnant the therapist pointing out that he was not able to do the same when it came to cocaine.  Shant questions if it is possible for a person to  stop using substances while never attending any meetings.  The therapist encourages Eissa to stay connected to meetings until he has at least 12 months of sobriety and then to avoid being around any people, places, or things associated with addictive substances for the remainder of his life.  The therapist also explains that if Anthonie or other group members try to handle life problems completely on their own without being willing to reach out to others that their long-term prognosis in relation to sobriety is not good.          Progress Towards Goals: Kuron reports no change in his sobriety date.  UDS collected: No Results: No   AA/NA attended?:  No   Sponsor?:  No   Elsie Maier, MA, [place], [facility], LCAS [name], MS, LMFT, LCAS 06/20/2024

## 2024-06-23 ENCOUNTER — Ambulatory Visit (INDEPENDENT_AMBULATORY_CARE_PROVIDER_SITE_OTHER): Admitting: Licensed Clinical Social Worker

## 2024-06-23 DIAGNOSIS — F1420 Cocaine dependence, uncomplicated: Secondary | ICD-10-CM | POA: Diagnosis not present

## 2024-06-23 DIAGNOSIS — F1211 Cannabis abuse, in remission: Secondary | ICD-10-CM

## 2024-06-23 NOTE — Progress Notes (Signed)
 Daily Group Progress Note   Program: CD IOP     Group Time: 9 a.m. to 12 p.m.      Type of Therapy: Process and Psychoeducational    Topic: The therapist checks in with group members, assesses for SI/HI/psychosis and overall level of functioning. The therapist inquires about sobriety date and number of community support meetings attended since last session.   The therapist has group members complete and discuss the [facility] Internal Trigger Questionnaire ERS 3A. The therapist discusses distress tolerance and presents research that supports that people's ability to deal with distress improves the longer one is sober from drugs and alcohol. The therapist confronts the myth about the doctor who could not operate if not intoxicated. The therapist shares data to support that people are better drivers, musicians, artists, engineering geologist upon gaining long-term sobriety than they ever were in active addiction. The therapist explains that working Steps is more in depth than it appears and is a life-long process. The therapist observes that active addiction is not compatible with spirituality. He covers the three C's of dealing with an addict: I didn't cause it. I can't cure it. I can't control it and the four D's of dealing with cravings: delay, distract, de-stress, and de-catastrophize. Lastly, he covers the three P's of addiction: Patience, Persistence, and Perseverance.    Summary: [name] presents rating his depression and anxiety both as a 0.   He says that when he came to treatment that his substance use was routine rather than triggered by an emotional state. He says that he used in relation to sex.   [name] concludes that the emotional state which would most make him want to use is anger experienced after an argument with his partner.  When another group member talks about doing steps and alludes to Step 8, [name] asks what sort of things he would have to make amends about.      He once again  alludes to when group is over as he did when he last attended group asking about aftercare. The therapist informs [name] that what will hopefully aid him in not returning to use would be meeting attendance and having a Sponsor also informing him that he could schedule individual appointments for therapy as well.      Progress Towards Goals: [name] reports no change in his sobriety date.  UDS collected: Yes Results: Yes, negative for drugs   AA/NA attended?: Yes   Sponsor?:  No   [name], [place], [place], [facility], LCAS 06/23/2024

## 2024-06-25 ENCOUNTER — Ambulatory Visit (INDEPENDENT_AMBULATORY_CARE_PROVIDER_SITE_OTHER)

## 2024-06-25 DIAGNOSIS — F1420 Cocaine dependence, uncomplicated: Secondary | ICD-10-CM

## 2024-06-25 NOTE — Progress Notes (Signed)
 Daily Group Progress Note   Program: CD IOP     Group Time: 9 a.m. to 12 p.m.      Type of Therapy: Process and Psychoeducational    Topic: The therapists checks in with group members, assesses for SI/HI/psychosis and overall level of functioning. The therapist inquires about sobriety date and number of community support meetings attended since last session.   The therapists discuss and prompt discussion on the following issues:  the 3 meeting that happens at 12 step meetings: the before meeting, the meeting and the meeting after, what the purpose of each meeting is for, what a home group is, how to become a member and the responsibilities of a home group member, online sites to locate 12 step meetings, including SMART recovery, had group members complete and discuss responses to SMART recovery Cost/Benefit analysis, how sex addiction is treated with the same therapies that treat substance use disorders.    Summary: [name] presents rating his depression and anxiety both as a 0. He says he has attended a virtual meeting since last IOP group.  He does not have a sponsor. [name] says he plans to attend an in person meeting before this Friday.  [name] does not have a sponsor.  He identifies his emotions as good and Happy.   In response to the Cost/Benefit Analysis, [name] says the cost of his using is paranoia and the benefit of using is cocaine is an sexual aphrodisiac, can save money, feel better about self.  [name] says his take away from today is learning that sex addiction is treated with same interventions as substance addictions. Therapist recommends the book Out of the Shadows by  Belvie Conradi.     Progress Towards Goals: [name] reports no change in his sobriety date.  UDS collected: No  Results: None   AA/NA attended?: Yes   Sponsor?:  No  [name], MS, LMFT, LCAS [address], [place], [place], [facility], LCAS 06/25/2024

## 2024-06-27 ENCOUNTER — Telehealth (HOSPITAL_COMMUNITY): Payer: Self-pay | Admitting: Licensed Clinical Social Worker

## 2024-06-27 ENCOUNTER — Ambulatory Visit (HOSPITAL_COMMUNITY): Payer: Self-pay

## 2024-06-27 NOTE — Telephone Encounter (Signed)
 The therapist received a voicemail from [name] saying that he will not be in group today; however, he provides no explanation as the reason he will not be able to attend.  [name], MA, LCSW, [facility], LCAS 06/27/2024

## 2024-06-30 ENCOUNTER — Encounter (HOSPITAL_COMMUNITY): Payer: Self-pay | Admitting: Medical

## 2024-06-30 ENCOUNTER — Ambulatory Visit (INDEPENDENT_AMBULATORY_CARE_PROVIDER_SITE_OTHER): Payer: Self-pay | Admitting: Medical

## 2024-06-30 DIAGNOSIS — M1A9XX0 Chronic gout, unspecified, without tophus (tophi): Secondary | ICD-10-CM

## 2024-06-30 DIAGNOSIS — F1211 Cannabis abuse, in remission: Secondary | ICD-10-CM

## 2024-06-30 DIAGNOSIS — F1420 Cocaine dependence, uncomplicated: Secondary | ICD-10-CM | POA: Diagnosis not present

## 2024-06-30 DIAGNOSIS — I10 Essential (primary) hypertension: Secondary | ICD-10-CM

## 2024-06-30 DIAGNOSIS — N189 Chronic kidney disease, unspecified: Secondary | ICD-10-CM

## 2024-06-30 DIAGNOSIS — N529 Male erectile dysfunction, unspecified: Secondary | ICD-10-CM

## 2024-06-30 DIAGNOSIS — Z96641 Presence of right artificial hip joint: Secondary | ICD-10-CM

## 2024-06-30 DIAGNOSIS — M1612 Unilateral primary osteoarthritis, left hip: Secondary | ICD-10-CM

## 2024-06-30 DIAGNOSIS — Z86711 Personal history of pulmonary embolism: Secondary | ICD-10-CM

## 2024-06-30 DIAGNOSIS — M47817 Spondylosis without myelopathy or radiculopathy, lumbosacral region: Secondary | ICD-10-CM

## 2024-06-30 DIAGNOSIS — Z5329 Procedure and treatment not carried out because of patient's decision for other reasons: Secondary | ICD-10-CM

## 2024-06-30 NOTE — Progress Notes (Signed)
 Daily Group Progress Note   Program: CD IOP     Group Time: 9 a.m. to 12 p.m.      Type of Therapy: Process and Psychoeducational    Topic: The therapists check in with group members, assess for SI/HI/psychosis and overall level of functioning. The therapists inquire about sobriety date and number of community support meetings attended since last session.   The therapists explain the reason that SA IOP and Twelve Step programs are abstinence-based via showing the video, The Psychology of Addiction by Roni Stager as well as a portion of the video, New Perspectives on Addiction & Recovery. The therapists explain that alcohol is a drug that disinhibits the prefrontal cortex in addition to discussing the cross tolerance between benzodiazepines and alcohol and why benzodiazepines are contraindicated in persons with alcohol us  disorders. The therapists also stress that any amount of use is too much and that persons in recovery must avoid people, place, and things associated with using.    Summary: [name] presents rating his depression and anxiety both as a 0. He attended his first in person meeting on [address] saying that he did not like it so plans on trying some others. He still does not have a Marketing Executive.  His mood is happy and he says that he feels good. [name] went [place] an event [place] where there was drinking. He was thinking of taking a drink as his wife does not like to drink alone; however, he told her that he did not want to do so.  [name] is surprise to learn that he would have had a new sobriety date if he had drunk questioning why he cannot drink if he is not in the program for his alcohol use but for cocaine. The therapists reemphasize that this is an abstinence-based program and go into the science of addiction to explain how drinking alcohol would likely negatively impact his sobriety from cocaine.     Progress Towards Goals: [name] reports no change in his sobriety  date.  UDS collected: Yes Results: Yes, negative for cocaine   AA/NA attended?: Yes   Sponsor?:  No   Elsie Maier, MA, [place], [facility], LCAS [name], MS, LMFT, LCAS 06/30/2024

## 2024-06-30 NOTE — Progress Notes (Signed)
   [facility] Follow-up Outpatient CDIOP Date: 06/30/2024  Admission Date:05/26/2024  Sobriety date:05/14/2024   Subjective: Doing well  HPI : CD IOP Provider FU [name] is now 35 days into treatment and 47 days from last use. He has been aware of his crossing the line from recreational use to addiction since first weks of treatment. He has been doing virtual meetings to assess where he might do live meetings and get a sponsor. He reports no cravings/no interest for cocaine .[name] He went to [employer] where beer was being served but he didn't drink and he was unaware that this is a forbidden substance in IOP as he was here for cocaine.Says group discussed this today   Counselor's report: Summary: [name] presents rating his depression and anxiety both as a 0. He says he has attended a virtual meeting since last IOP group.  He does not have a sponsor. [name] says he plans to attend an in person meeting before this Friday.  [name] does not have a sponsor.  He identifies his emotions as good and Happy.    In response to the Cost/Benefit Analysis, [name] says the cost of his using is paranoia and the benefit of using is cocaine is an sexual aphrodisiac, can save money, feel better about self.   [name] says his take away from today is learning that sex addiction is treated with same interventions as substance addictions. Therapist recommends the book Out of the Shadows by  Belvie Conradi.      Review of Systems: Psychiatric: Agitation: See Counselor report Hallucination: No Depressed Mood: see Counselor reports Insomnia: No Hypersomnia: No Altered Concentration: No Feels Worthless: No Grandiose Ideas: No Belief In Special Powers: No New/Increased Substance Abuse: No Compulsions: In early withdrawal  Neurologic: Headache: No Seizure: No Paresthesias: No  Current Medications: Your Medication List  amLODipine  10 MG tablet Commonly known as: NORVASC  Take 1 tablet  (10 mg total) by mouth daily.  colchicine  0.6 MG tablet Take 0.6 mg by mouth daily as needed (gout flares).  folic acid  1 MG tablet Commonly known as: FOLVITE  Take 1 tablet by mouth once daily  sildenafil  100 MG tablet Commonly known as: Viagra  Take 1 tablet (100 mg total) by mouth daily as needed for erectile dysfunction.    Mental Status Examination  Appearance:Casual neat Alert: Yes Attention: good  Cooperative: Yes Eye Contact: Good Wears glasses Speech: Clear and coherent, rate WNL Psychomotor Activity: Normal Memory:Intact Concentration/Attention: Normal/intact Oriented: person, place, time/date and situation Mood: Euthymic Affect: Appropriate and Congruent Thought Processes and Associations: Coherent and Intact Fund of Knowledge:WDL Thought Content: Logical /No SI/HI/Denies craving/obsessions Insight:Improving Judgement:Improving  LID:Rozjm  PDMP: 04/17/2024 04/17/2024  1 Oxycodone  Hcl (Ir) 5 Mg Tablet 6.00 2 Gr Met  Post op  Diagnosis:  Cocaine use disorder, severe, dependence (HCC) History of cannabis abuse Procedure and treatment not carried out because of patient's decision for other reasons Essential hypertension, benign History of pulmonary embolus (PE) Chronic kidney disease, unspecified CKD stage Spondylosis of lumbosacral region, unspecified spinal osteoarthritis complication status Erectile dysfunction, unspecified erectile dysfunction type S/p revision of right total hip Primary osteoarthritis of left hip Chronic gout without tophus, unspecified cause,   Assessment: Responding to treatment  Treatment Plan:per Counselors and admission Encouraged to explore lic ve meetings and establish sponsorship relationship Erik Emmer, PA-CPatient ID: [name]., male   DOB: [birth year], 58 y.o.   MRN: [id number]

## 2024-07-02 ENCOUNTER — Telehealth (HOSPITAL_COMMUNITY): Payer: Self-pay

## 2024-07-02 ENCOUNTER — Ambulatory Visit (HOSPITAL_COMMUNITY): Payer: Self-pay

## 2024-07-02 NOTE — Telephone Encounter (Signed)
 [name] calls this morning and says he will not be in [facility] IOP today as he is taking his wife to a medical appointment and he plans to attend on Friday.  [name], MS, LMFT, LCAS

## 2024-07-03 ENCOUNTER — Telehealth (HOSPITAL_COMMUNITY): Payer: Self-pay

## 2024-07-03 NOTE — Telephone Encounter (Signed)
 Therapist calls [facility] Medicaid as the call has not been returned regarding seeking a continued stay authorization on this pt for the service of H0015, SAIOP.  A message automatically came on saying my message would roll over to a mail box that is checked daily. Therapist left the information that the message said to leave requesting a return call back.  [name], MS, LMFT, LCAS 11.6.2025

## 2024-07-03 NOTE — Telephone Encounter (Signed)
 Therapist calls [facility] Medicaid for a continued stay authorization on this pt as pt's 30 day pass through has ended. Therapist reaches a voice mail which says the call will be rolled over to a mail box which is checked on a daily basis.  Therapist leaves the information requested and asks for a return call.  [name], MS, LMFT, LCAS 06-30-2024

## 2024-07-03 NOTE — Telephone Encounter (Signed)
 Therapist calls [facility] Medicaid  to obtain continued stay authorization, as [name] has had his 30 day pass through on [facility] IOP.  When therapist pressed the option for authorizations, a message came on saying to leave a message and the call would be returned in 48 hours.  [name], MS, LMFT, LCAS

## 2024-07-04 ENCOUNTER — Ambulatory Visit (INDEPENDENT_AMBULATORY_CARE_PROVIDER_SITE_OTHER): Payer: Self-pay

## 2024-07-04 DIAGNOSIS — F1420 Cocaine dependence, uncomplicated: Secondary | ICD-10-CM

## 2024-07-04 NOTE — Progress Notes (Signed)
 Daily Group Progress Note   Program: CD IOP     Group Time: 9 a.m. to 12 p.m.      Type of Therapy: Process and Psychoeducational    Topic: The therapists checks in with group members, assesses for SI/HI/psychosis and overall level of functioning. The therapist inquires about sobriety date and number of community support meetings attended since last session.   The therapists introduce the new group member. Therapists discuss and prompt discussion on the following issues today: Cannabis Hyperemesis, Treatment Centers that do not follow the science of addiction, as they will not admit persons who have the diagnosis of cannabis use disorder, unless they have another drug use disorder in addition to the diagnosis with the idea that cannabis is not a serious addiction that requires residential treatment, how perspectives differ among people, how a person in unable to think oneself out of addition, recovery taking a leap of faith requiring trust in the process of recovery and being open to listening what has helped others even when one totally does not totally intellectually understand how certain processes work, discuss co-dependency and the pitfalls of not putting one's recovery first, rescue fantasies.    Summary: Yadiel presents rating his depression and anxiety both as a 0. He reports he has the same sobriety date and has almost 1 months sober. He says he is attending some virtual meetings. He does not have a sponsor.  He says he is dropping a peer off at an in person meeting after group but can't stay because he is going to visit his aunt who is hospitalized.   Progress Towards Goals: Tallis reports no change in his sobriety date.  UDS collected: No  Results: Negative   AA/NA attended?: Yes   Sponsor?:  No  [name], MS, LMFT, LCAS [address], [place], [place], [facility], LCAS 07/04/2024

## 2024-07-07 ENCOUNTER — Ambulatory Visit (INDEPENDENT_AMBULATORY_CARE_PROVIDER_SITE_OTHER): Payer: Self-pay | Admitting: Licensed Clinical Social Worker

## 2024-07-07 DIAGNOSIS — F1211 Cannabis abuse, in remission: Secondary | ICD-10-CM

## 2024-07-07 DIAGNOSIS — F1420 Cocaine dependence, uncomplicated: Secondary | ICD-10-CM | POA: Diagnosis not present

## 2024-07-07 NOTE — Progress Notes (Signed)
 Daily Group Progress Note   Program: CD IOP     Group Time: 9 a.m. to 12 p.m.      Type of Therapy: Process and Psychoeducational    Topic: The therapists check in with group members, assess for SI/HI/psychosis and overall level of functioning. The therapists inquire about sobriety date and number of community support meetings attended since last session.   The therapists facilitate group discussion on a number of topics including isolation as something to be avoided by people in recovery as well as bereavement support programs to learn to handle grief in a healthy manner so that it is not a trigger for using. The therapists share what research says about regular Twelve Step and/or mutual sober support programs leading to higher rates of abstinence compared to those who attend occasionally or do not attend at all. The therapists explain that a core principle of the Twelve Step program is that they never give up on people and welcome them back if they relapse as this is a relapsing disease until people eventually identify all of their triggers and learn ways to deal with them without using. The therapists discuss the concept of addicts helping other addicts and how service work helps many to remain sober.     Summary: [name] presents rating his depression and anxiety both as a 0. He reports no change in his sobriety date saying that he attended a virtual meeting over the weekend and plans on attending an in person AA meeting today with two other men from group immediately after group ends today.  He describes his mood as happy and excited. He reports being sober a month with no cravings or desire to use. The therapist explores [name]'s sober support system observing that [name] seems to be somewhat of a loner which he says has always been the case. He talks about his cousins who he must avoid as they heaving use weed and other drugs. He says that he will go and play pool with a couple of guys  who do not drink or use drugs.  When another group member talks about her estranged relationship with her mother, [name] talks about having a similar relationship with is father saying that they have managed to overcome their differences and get closer over the past few years. He says that his father was not much of a father when he was growing up.    Progress Towards Goals: [name] reports no change in his sobriety date.  UDS collected: Yes Results: No   AA/NA attended?: Yes   Sponsor?:  No   Elsie Maier, MA, [place], [facility], LCAS [name], MS, LMFT, LCAS 07/07/2024

## 2024-07-09 ENCOUNTER — Ambulatory Visit (INDEPENDENT_AMBULATORY_CARE_PROVIDER_SITE_OTHER): Payer: Self-pay | Admitting: Licensed Clinical Social Worker

## 2024-07-09 DIAGNOSIS — F1420 Cocaine dependence, uncomplicated: Secondary | ICD-10-CM

## 2024-07-09 DIAGNOSIS — F1211 Cannabis abuse, in remission: Secondary | ICD-10-CM

## 2024-07-09 LAB — TOXICOLOGY SCREEN, URINE

## 2024-07-09 NOTE — Progress Notes (Signed)
 Daily Group Progress Note   Program: CD IOP     Group Time: 9 a.m. to 12 p.m.      Type of Therapy: Process and Psychoeducational    Topic: The therapists check in with group members, assess for SI/HI/psychosis and overall level of functioning. The therapists inquire about sobriety date and number of community support meetings attended since last session.   The therapists introduce a new group member while announcing the recent passing of another. The therapists discuss the importance of tobacco cessation making group members aware of [facility] [place]. The therapists explain that quitting is not only one of the best things a person can do for his or her health but also increases their ability to achieve long-term sobriety from drugs and alcohol.  The therapists discuss the importance of assertiveness as it relates to recovery and overcoming grief. The therapists continue to emphasize the importance of avoiding people who are in active addiction and challenge group members to consider why others in active addiction take the inventories of other people rather than focusing on themselves. Also, they encourage group members to consider why people in active addiction are not a healthy source of emotional support.     Summary: [name] presents today rating both his depression and anxiety as a 0.  He attended his second in person AA meeting attending with 2 other group members.  He says that he liked the meeting but wants to go to an in person narcotics Anonymous meeting.  He does offer to provide transportation to AA meetings to other group members if they do not have it. [name] denies that he has an issue with alcohol saying that he would only rarely drink on special occasions.  He indicates that he has a question about this that he wants to ask this therapist but never gets around to doing so.  At the beginning of group, he asks this therapist what will happen regarding his treatment if he were to get a job  with this therapist covering an array of different contingencies depending on whether or not he is at the point of being ready to step down from IOP when he gets this hypothetical job.  He describes his mood as happy and bad as a result of having learned about the passing of a former group member.     Progress Towards Goals: [name] reports no change in his sobriety date.  UDS collected: No Results: No   AA/NA attended?: Yes   Sponsor?:  No   Elsie Maier, MA, [place], [facility], LCAS [name], MS, LMFT, LCAS 07/09/2024

## 2024-07-11 ENCOUNTER — Ambulatory Visit (INDEPENDENT_AMBULATORY_CARE_PROVIDER_SITE_OTHER): Payer: Self-pay

## 2024-07-11 DIAGNOSIS — F1420 Cocaine dependence, uncomplicated: Secondary | ICD-10-CM

## 2024-07-11 NOTE — Progress Notes (Signed)
 Daily Group Progress Note   Program: CD IOP     Group Time: 9 a.m. to 12 p.m.      Type of Therapy: Process and Psychoeducational    Topic: The therapists check in with group members, assess for SI/HI/psychosis and overall level of functioning. The therapists inquire about sobriety date and number of community support meetings attended since last session.   Therapist discusses the purpose of chips in 12 step meetings: how picking up a white chip can signal to others that the person is in need of support, chips can also given an acknowledgment/reinforcement for time spent in sobriety. Therapist discusses types of communication, those being passive, assertive, aggressive and passive aggressive, noting  that passive communication priorities other's needs before their own and violates their own rights, Assertive communication which respects both one's own needs and the needs of others while aggressive communication violates the rights of others while the person feels their own needs have priority. Therapist debunked the three myths of assertiveness including assertiveness is basically the same as being aggressive, If I am assertive I will get what I want and if I am assertive I have to be assertive in every situation. Therapist distributes the exercise where clients rate themselves as being less or more able to be assertive with several different areas. Within the time constraints the group covering the first situation of being assertive, that being to rate how difficult how difficult it is to say no.  With the report of a couple of members reporting they have stopped the use of their baclofen due to cravings disappearing, therapist discusses the risks of stopping as they may find craving returning. Discuss the importance of MAT especially in early recovery.   Summary: [name] presents rating his depression and anxiety as a '0'.  He reports he attended a occupational hygienist. He does not yet have a sponsor.  [name] identifies his emotions as good and excited.   [name] says he has not filled out a job application in 17 years as he worked the same job for that [employer] of time. He says he had thought he would like to do something less stressful so he is excited about looking for this type job.  He offers to take his peers to a meeting today but explains he can't go.   [name] reporting having pain in his shoulder when he sleeps so he is going to the MD today to try to find about more about this situation.  In reference to assertiveness, [name] says he has no problem saying no to others because he likes to nip things in the bud. He says he will walk away from people who communicate aggressively as they are not rational and one cannot reason with them.    Progress Towards Goals: [name] reports no change in his sobriety date.  UDS collected: Yes Results: Positive for Dextromethorphan (has used it recently for night cough)   AA/NA attended?: Yes   Sponsor?:  No    [name], MS, LMFT, LCAS 07/11/2024

## 2024-07-12 ENCOUNTER — Other Ambulatory Visit: Payer: Self-pay | Admitting: Family Medicine

## 2024-07-14 ENCOUNTER — Ambulatory Visit

## 2024-07-14 ENCOUNTER — Ambulatory Visit (INDEPENDENT_AMBULATORY_CARE_PROVIDER_SITE_OTHER): Admitting: Licensed Clinical Social Worker

## 2024-07-14 ENCOUNTER — Encounter: Payer: Self-pay | Admitting: Family Medicine

## 2024-07-14 ENCOUNTER — Ambulatory Visit: Admitting: Family Medicine

## 2024-07-14 VITALS — BP 132/90 | HR 55 | Temp 98.2°F | Ht 74.0 in | Wt 239.5 lb

## 2024-07-14 DIAGNOSIS — I10 Essential (primary) hypertension: Secondary | ICD-10-CM | POA: Diagnosis not present

## 2024-07-14 DIAGNOSIS — R053 Chronic cough: Secondary | ICD-10-CM | POA: Diagnosis not present

## 2024-07-14 DIAGNOSIS — F1420 Cocaine dependence, uncomplicated: Secondary | ICD-10-CM | POA: Diagnosis not present

## 2024-07-14 DIAGNOSIS — R062 Wheezing: Secondary | ICD-10-CM

## 2024-07-14 DIAGNOSIS — M1A09X0 Idiopathic chronic gout, multiple sites, without tophus (tophi): Secondary | ICD-10-CM

## 2024-07-14 DIAGNOSIS — M2550 Pain in unspecified joint: Secondary | ICD-10-CM | POA: Diagnosis not present

## 2024-07-14 DIAGNOSIS — F1211 Cannabis abuse, in remission: Secondary | ICD-10-CM

## 2024-07-14 MED ORDER — ALBUTEROL SULFATE HFA 108 (90 BASE) MCG/ACT IN AERS: 2.0000 | INHALATION_SPRAY | Freq: Four times a day (QID) | RESPIRATORY_TRACT | 2 refills | Status: DC | PRN

## 2024-07-14 MED ORDER — FAMOTIDINE 20 MG PO TABS
20.0000 mg | ORAL_TABLET | Freq: Two times a day (BID) | ORAL | 2 refills | Status: AC
Start: 2024-07-14 — End: ?

## 2024-07-14 MED ORDER — COLCHICINE 0.6 MG PO TABS: 0.6000 mg | ORAL_TABLET | Freq: Every day | ORAL | 2 refills | Status: AC | PRN

## 2024-07-14 MED ORDER — AMLODIPINE BESYLATE 10 MG PO TABS: 10.0000 mg | ORAL_TABLET | Freq: Every day | ORAL | 1 refills | Status: AC

## 2024-07-14 MED ORDER — MELOXICAM 15 MG PO TABS: 15.0000 mg | ORAL_TABLET | Freq: Every day | ORAL | 5 refills | Status: AC

## 2024-07-14 NOTE — Progress Notes (Signed)
 Daily Group Progress Note   Program: CD IOP     Group Time: 9 a.m. to  11 a.m.   Type of Therapy: Process and Psychoeducational    Topic: The therapists check in with group members, assess for SI/HI/psychosis and overall level of functioning. The therapists inquire about sobriety date and number of community support meetings attended since last session.   The therapist address the issue of co-dependence as it relates to recovery explaining that persons with co-dependency issues have a pathological need to be needed which is the reason that they get into caretaker relationships with persons with dependency issues.   The therapist facilitates a discussion on trauma as it relates to addiction and as it relates to issues with chronic anxiety. The therapist explains that breathing exercises and mindfulness practices have limits on their effectiveness in relation to anxiety related to severe trauma as trauma can cause permanent changes to one's brain and nervous system. Thus, persons with PTSD may need psychotropic medication in addition to these other tools.  The therapist normalizes grieving over one's addiction pointing out that it served a function; however, one must realize that he or she cannot have the positives of using without the negatives as they are intertwined. The therapist suggests that many treatment facilities have patients write letters to their disease to work through these feelings.  The therapists explain the differences between passive, assertive, and aggressive responses and have group members complete an exercise rating how assertive they can be in different social situations.     Summary: [name] presents today rating both his depression and anxiety as a 0.  He says that his gout flared up over the past weekend to the point that he had to walk with a cane.  He says that he will have to leave group an hour early today as he is seeing his doctor about this condition.  The  therapist makes [name] aware of the Narcotics Anonymous meeting that occurs from 1 to 2 PM on weekdays at the [facility] and he says that he will attend this meeting tomorrow and see if he might be able to find a sponsor.  He describes his mood as good and excited.  He is beginning to look into jobs that he might be able to work saying that he is mostly interested in either driving or working in a engineer, materials.  He says that his unemployment ends next month.  He talks about possibly wanting to work a third shift so that he could continue with IOP if needed.  During today's group, he talks about having cheated on his wife in the past because he was not getting what he needed at home in terms of sex.  The therapist explains how responding to this situation via an affair is a passive-aggressive response as opposed to being assertive.   Progress Towards Goals: [name] reports no change in his sobriety date.  UDS collected: Yes results: No   AA/NA attended?: Yes   Sponsor?:  No   Elsie Maier, MA, [place], [facility], LCAS [name], MS, LMFT, LCAS 07/14/2024
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
## 2024-07-15 LAB — URIC ACID: Uric Acid, Serum: 7.7 mg/dL (ref 4.0–7.8)

## 2024-07-15 LAB — RHEUMATOID FACTOR: Rheumatoid fact SerPl-aCnc: <10 [IU]/mL (ref <14)

## 2024-07-16 ENCOUNTER — Ambulatory Visit (INDEPENDENT_AMBULATORY_CARE_PROVIDER_SITE_OTHER)

## 2024-07-16 ENCOUNTER — Ambulatory Visit: Payer: Self-pay | Admitting: Family Medicine

## 2024-07-16 DIAGNOSIS — F1420 Cocaine dependence, uncomplicated: Secondary | ICD-10-CM

## 2024-07-16 DIAGNOSIS — F1211 Cannabis abuse, in remission: Secondary | ICD-10-CM

## 2024-07-16 LAB — TOXICOLOGY SCREEN, URINE: Creatinine, POC: 201 mg/dL

## 2024-07-16 NOTE — Progress Notes (Signed)
 Daily Group Progress Note   Program: [facility]     Group Time: 9 a.m. to 12 p.m.      Type of Therapy: Process and Psychoeducational    Topic: The therapists check in with group members, assess for SI/HI/psychosis and overall level of functioning. The therapists inquire about sobriety date and number of community support meetings attended since last session.  The therapist facilitate discussions on several different topics including the need to be selfish in one's recovery putting recovery above everything else, the need to stop interacting up interacting with people and active addiction in their personal lives, and how eventually coming out as an addict or alcoholic can be liberating and help to shutdown the efforts of those trying to pull them back into using. The therapists continue to stress the importance of assertive communication.     Summary: [name] presents today rating both of his depression NA of his depression and anxiety as a 0.  He says that he feels badly about not having attended a NA meeting as he said that he would.  He continues to have no sponsor.  He says that he may be drive by the location of the NA meeting to see where it is; however, when challenged by this therapist, he changes his statement to he will drive by there.  He says that he is excited as he is looking for a job.  He did a virtual meeting adding that he left his camera on while others did not.  As a result of being sober, he says that his body is feeling better and that he ran into a guy who is still using cocaine who asked about where [name] went for recovery being interested in starting it himself given how much better [name] appears to him.  [name] admits that he sometimes feels that his story is not as severe as others with the therapist observing that [name] is somewhat of a statistical anomaly and that he was proactive in seeking help will be for hitting bottom.  He says that it was a big step for him  to open up to people in his life about his cocaine use given that none of them were aware of it.  He concludes that he will probably start reading his NA book again especially as it was gifted to him by a group member who passed away recently with whom [name] had become close.   Progress Towards Goals: [name] reports no change in his sobriety date.  UDS collected: No Results: No   AA/NA attended?: Yes   Sponsor?:  No    Erik Maier, MA, [place], [facility], LCAS [name], MS, LMFT, LCAS 07/16/2024

## 2024-07-18 ENCOUNTER — Ambulatory Visit (INDEPENDENT_AMBULATORY_CARE_PROVIDER_SITE_OTHER)

## 2024-07-18 DIAGNOSIS — F1420 Cocaine dependence, uncomplicated: Secondary | ICD-10-CM

## 2024-07-18 NOTE — Progress Notes (Signed)
 Daily Group Progress Note   Program: [facility] IOP     Group Time: 9 a.m. to 12 p.m.      Type of Therapy: Process and Psychoeducational    Topic: The therapists check in with group members, assess for SI/HI/psychosis and overall level of functioning. The therapists inquire about sobriety date and number of community support meetings attended since last session.  The therapists show the video of Juliene Linnette Sarah, Why you're outgrowing your friends. Adam discusses how outgrowing friendships in recovery is not a betrayal, rather it is a breakthrough.  He explains as you grow, your associate and friends may not grow with you. He points out how to recognize misaligned relationships and how they can interfere with protecting your own peace and making room for new connections.  Adam asks 4 questions on the Friendship Inventory: 1) who builds you up, 2) who drains you, 3) who do you become when you are around these people, 4) what are you contributing. He also asks people in recovery to name the 5 people closest to you which sets your trajectory.   Summary: [name] presents today rating both of his depression and anxiety as a 0.  He reports he has the same sobriety date.  He is attending virtual meetings and intends to attend his 3rd in person meeting this evening.  [name] identifies his emotions as good and excited:  [name] answers the above questions as follows: 1) my wife builds me up, 2) no one is draining me, 3) I don't change when I am around others and 4) I am contributing by staying 'clean'.  [name] says the people who are closet to him are his wife, his daughter and his father.  His take away from today is to put people behind you if they are not helping build you up and moving forward.  Progress Towards Goals: [name] reports no change in his sobriety date.  UDS collected: no Results: Negative   AA/NA attended?: Yes   Sponsor?:  No    [name], MS, LMFT, LCAS [address] [place],  [place], [facility], LCAS 07/18/2024

## 2024-07-21 ENCOUNTER — Telehealth: Payer: Self-pay | Admitting: Family Medicine

## 2024-07-21 ENCOUNTER — Ambulatory Visit (INDEPENDENT_AMBULATORY_CARE_PROVIDER_SITE_OTHER)

## 2024-07-21 DIAGNOSIS — F1420 Cocaine dependence, uncomplicated: Secondary | ICD-10-CM

## 2024-07-21 NOTE — Progress Notes (Signed)
 Daily Group Progress Note   Program: CD IOP     Group Time: 9 a.m. to 12 p.m.      Type of Therapy: Process and Psychoeducational    Topic: The therapists check in with group members, assess for SI/HI/psychosis and overall level of functioning. The therapists inquire about sobriety date and number of community support meetings attended since last session.    In response to group questions, therapist discusses the 3 stages of emotional relapse and elaborates on each one with providing examples and inviting conversation on each stage. The three stages of relapse are emotional, mental and physical.  Therapist discusses the Matrix Model worksheet of Holidays and Recovery with the upcoming holidays nearing. Therapist asks group members to indicate which situations in the list that could cause a problem for recovery during the holidays. Therapist explains if one when from 1-3 items the holidays only produce a slight increase in their risk of relapse. For 4-6 items, it indicated that the holidays add a lot of stress to your life. Therapist notes that Relapse risk is related to how well you cope with increased stress. Therapist elaborates on having 7 or more items checked and how this notes that the holidays add a major amount of stress to their lives and relapse prevent means learning how to recognize added stress and taking extra care during dangerous periods.  Therapist discusses the importance of having a relapse plan. Therapist discusses the role of dopamine in addiction.   Summary: [name] presents today rating both of his depression and anxiety as a 0.  He reports he has the same sobriety date.  He is attending virtual meetings. He says he wants to get back to an in person meeting so he can get a sponsor. [name] identifies his emotion as good.  He says he went to a birthday party on Saturday and went up to the bar and got a cranberry juice. He says people had made comments about why he was not  having a drink so carrying a cranberry juice around seemed to stop that questions.  We discuss the dangers of placing oneself around triggers.  [name] says he agrees but stated he had not been in that situation in a good while and does not plan to put himself in that situation again.  [name] discusses how he can sees himself in the items on the Holiday and Recovery list.  He says his use stressed his wife out and he thought she may leave.  [name] notes CD IOP has been a reset for him.  [name] says he has a target date of finding a job and returning to work by the first of the new year.  [name] asks several questions during the explanation of the 3 stages of relapse, including questions of how to interrupt each stage. He also asks questions about dopamine and how this affects the addiction cycle.  Progress Towards Goals: [name] reports no change in his sobriety date.  UDS collected: no Results: Negative   AA/NA attended?: Yes   Sponsor?:  No    [name], MS, LMFT, LCAS  07/21/2024

## 2024-07-21 NOTE — Telephone Encounter (Signed)
 Patient dropped off document tinted window waiver application, to be filled out by provider. Patient requested to send it back via Call Patient to pick up within 7-days. Document is located in providers tray at front office.Please advise at Mobile [phone] (mobile)

## 2024-07-23 ENCOUNTER — Ambulatory Visit (HOSPITAL_COMMUNITY): Admitting: Licensed Clinical Social Worker

## 2024-07-23 DIAGNOSIS — F1211 Cannabis abuse, in remission: Secondary | ICD-10-CM

## 2024-07-23 DIAGNOSIS — F1420 Cocaine dependence, uncomplicated: Secondary | ICD-10-CM | POA: Diagnosis not present

## 2024-07-23 NOTE — Progress Notes (Signed)
 Daily Group Progress Note   Program: [facility] IOP     Group Time: 9 a.m. to 10:50 a.m       Type of Therapy: Process and Psychoeducational    Topic: The therapist checks in with group members, assesses for SI/HI/psychosis and overall level of functioning. The therapist inquires about sobriety date and number of community support meetings attended since last session.  The therapist provides education material on addiction as a disease versus a character defect while discussing the negative impact of consuming alcohol  on one's health. The therapist talks about the holidays being a potential trigger for using and uses a situation with a group member being discharged today to illustrate the difference between helping versus enabling and the part the dishonesty plays in relation to addiction as it is aimed at getting people to back off and allow the person in addiction to keep doing what he or she wrongly believes is critical to his or her survival.    Summary: [name] presents today saying that he will need to leave group early today as he and his wife are driving to [place]  for Thanksgiving to spend it with his wife's mother.   He says that his sobriety date is the same and that he did a virtual NA meeting but no in person. He says that he liked this meeting better than the other NA meeting that he attended. He has read a little of the Basic Text on what is an addict. The therapist asks if [name] believes he is in addict to which he answers in the affirmative. [name] says that he has learned a lot in group particularly concerning how severe a person's addiction to alcohol  can become. [name] clarifies with this therapist that an addict who has stopped using is not cured with the therapist informing him as to the reason addiction is considered a chronic but treatable condition.  [name] does not have a Ecologist that he got onto another group member for taking so long to get a Sponsor but he is  now becoming like this member. He says that he will likely attend the meeting he did virtually in person.  [name] admits that he feels good and excited about starting work next year. [name] provides feedback to another group member who is being discharged from group to a higher level of care as a result of lying about numerous things he has been doing which could land him in prison.    Progress Towards Goals: [name] reports no change in his sobriety date.  UDS collected: No Results: No   AA/NA attended?: Yes   Sponsor?:  No   Elsie Maier, [place], [place], [facility], LCAS 07/23/24

## 2024-07-23 NOTE — Telephone Encounter (Signed)
 Left a detailed message PCP completed the form at no charge, original left at the front desk and copy sent to be scanned.

## 2024-07-25 ENCOUNTER — Ambulatory Visit (HOSPITAL_COMMUNITY)

## 2024-07-28 ENCOUNTER — Ambulatory Visit (INDEPENDENT_AMBULATORY_CARE_PROVIDER_SITE_OTHER): Admitting: Licensed Clinical Social Worker

## 2024-07-28 DIAGNOSIS — F1420 Cocaine dependence, uncomplicated: Secondary | ICD-10-CM | POA: Diagnosis not present

## 2024-07-28 DIAGNOSIS — F1211 Cannabis abuse, in remission: Secondary | ICD-10-CM

## 2024-07-28 NOTE — Progress Notes (Signed)
 Daily Group Progress Note   Program: CD IOP     Group Time: 9 a.m. to 12 p.m.      Type of Therapy: Process and Psychoeducational    Topic: The therapists check in with group members, assess for SI/HI/psychosis and overall level of functioning. The therapists inquire about sobriety date and number of community support meetings attended since last session.  The therapists show group members how to access the Big Book on audio. The therapists explain the reasons that addiction is a genetically inheritable, brain-based disease that is both chronic and potentially progressive. The therapists educate group members on the fact that people build tolerance for life and discuss cross tolerance between benzodiazepines and alcohol . The therapists discuss the fact that many people's criminal behavior or issues with anger management are in actuality symptoms of active addiction. The therapists continue to emphasize the importance of avoiding people in active addiction and those prone to negativity while cultivating relationships with those in recovery working good programs who are supportive and encouraging and aligned with the new direction in which they want to go.   The therapists discuss the fact that most persons with addiction are functional in certain areas of their lives.    Summary: Draiden presents today rating both his depression and anxiety as a 0. He describes his mood a good and excited. He still has no Marketing Executive and has continued doing [employer] saying that he has a pamphlet another group member gave him with a listing of in person meetings.Lorenso has not continued any reading on the Basic Text.   The therapist questions how Ridley feels about getting a Marketing Executive with Osmani replying that he know that he needs to get a Marketing Executive. He says that if he were ever to be at risk of using as a result of being upset about something that he would call his Sponsor. He concluded that if he were  unable to reach his Sponsor that calling his daughter, who has no addiction issues, would be his back-up plan.  The therapist suggests that if Kenlee were attending more meetings and had a Sponsor that his Sponsor would likely want Wofford to get numbers and to start calling other men in the program so as to expand his sober support system rather than just relying solely on him.   Lotus talks about his father being an alcoholic and says that he knows he is an addict but questions if he is also an alcoholic in spite of the fact that he would only drink a couple of drinks at a time. The therapist inquires about his past drinking with Nathan saying that he and his friends would split a gallon of liquor with Tannar being the one to drive everyone home. When asked the reason that he was the one doing the driving, Atha says that it is because he had higher tolerance than his friends.  The therapist talks about the genetics of addiction and encourages Maxfield to treat all controlled substances, including alcohol , as potentially dangerous. Another group member observes that Nicholson would be more likely to use cocaine after drinking a couple of beers than he would sober.  Shlomo says that this is his last month not working and says that he needs to start looking. During this session, Deanna indicates that he did not experience any serious consequences from his cocaine use but stopped when he perceived that he was starting to crave it when not using. The therapist points out the fact that  different people have different bottoms. Dezi says that unlike his friends that he had a fear of going to prison which was instilled in him by his mother.    Progress Towards Goals: Savier reports no change in his sobriety date.  UDS collected: Yes Results: No   AA/NA attended?: Yes   Sponsor?:  No    Elsie Maier, MA, [place], [facility], LCAS [name], MS, LMFT, LCAS 07/28/24

## 2024-07-30 ENCOUNTER — Ambulatory Visit (INDEPENDENT_AMBULATORY_CARE_PROVIDER_SITE_OTHER)

## 2024-07-30 DIAGNOSIS — F1420 Cocaine dependence, uncomplicated: Secondary | ICD-10-CM | POA: Diagnosis not present

## 2024-07-30 NOTE — Progress Notes (Signed)
 Daily Group Progress Note   Program: CD IOP     Group Time: 9 a.m. to 12 p.m.      Type of Therapy: Process and Psychoeducational    Topic: The therapists check in with group members, assess for SI/HI/psychosis and overall level of functioning. The therapists inquire about sobriety date and number of community support meetings attended since last session.  Therapists read a reading from AA today which focuses on putting recovery above everything else. Therapists discuss and prompt discussion on the following topics: Human Trafficking, trauma as the interaction of addiction, importance of Changing you life when seeking recovery which includes building sober social connections, re-emphasize that addiction is a genetically pre-disposed brain disease, rather than there being addictive personality, discussed briefly Medication Assisted Treatment   Summary: [name] presents today rating both his depression and anxiety as a 0.   [name] reports he has been to a meeting and has identified a male he wants to ask if he will be [name]'s sponsor.   [name] says his former cocaine dealer called him. [name] says that things have changed and the dealer was asking about 12 step meetings and is considering getting help for his addiction.  [name] says he has not interacted with him since he stopped using.  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   Progress Towards Goals: [name] reports no change in his sobriety date.  UDS collected: no Results: None   AA/NA attended?: Yes   Sponsor?:  No   Erik Simpler, MS, LMFT, LCAS [address] [place], [place], [facility], LCAS  07/30/24

## 2024-07-31 ENCOUNTER — Telehealth (HOSPITAL_COMMUNITY): Payer: Self-pay

## 2024-07-31 NOTE — Telephone Encounter (Signed)
 Called Rockey and confirmed  I was speaking to the correct person by obtaining two verifiers.  Therapist asks Kham if he is planning to come tomorrow as the prediction is for inclement weather. He asks if he will be the only one. Therapist explains that there is a new person starting that this therapist has not been able to reach to ask them.  Matheau says he will just wait and come in on Monday.  [name], MS, LMFT, LCAS

## 2024-08-01 ENCOUNTER — Telehealth (HOSPITAL_COMMUNITY): Payer: Self-pay

## 2024-08-01 ENCOUNTER — Ambulatory Visit (HOSPITAL_COMMUNITY)

## 2024-08-01 NOTE — Telephone Encounter (Signed)
 Called pt to inform him group would meet given the inclement weather did not materialize.  Reached VM and left a HIPAA compliant VM.  [name], MS, LMFT, LCAS

## 2024-08-04 ENCOUNTER — Telehealth (HOSPITAL_COMMUNITY): Payer: Self-pay | Admitting: Licensed Clinical Social Worker

## 2024-08-04 ENCOUNTER — Ambulatory Visit (HOSPITAL_COMMUNITY)

## 2024-08-04 NOTE — Telephone Encounter (Signed)
 [name] leaves a voicemail saying that he will not be in group today as he has a job interview.  [name], MA, LCSW, [facility] LCAS 08/04/2024

## 2024-08-06 ENCOUNTER — Encounter (HOSPITAL_COMMUNITY): Payer: Self-pay | Admitting: Medical

## 2024-08-06 ENCOUNTER — Ambulatory Visit (INDEPENDENT_AMBULATORY_CARE_PROVIDER_SITE_OTHER): Admitting: Medical

## 2024-08-06 ENCOUNTER — Ambulatory Visit: Payer: Self-pay

## 2024-08-06 DIAGNOSIS — F1420 Cocaine dependence, uncomplicated: Secondary | ICD-10-CM

## 2024-08-06 DIAGNOSIS — Z86711 Personal history of pulmonary embolism: Secondary | ICD-10-CM

## 2024-08-06 DIAGNOSIS — M1A9XX0 Chronic gout, unspecified, without tophus (tophi): Secondary | ICD-10-CM

## 2024-08-06 DIAGNOSIS — N189 Chronic kidney disease, unspecified: Secondary | ICD-10-CM

## 2024-08-06 DIAGNOSIS — F1211 Cannabis abuse, in remission: Secondary | ICD-10-CM

## 2024-08-06 DIAGNOSIS — I10 Essential (primary) hypertension: Secondary | ICD-10-CM

## 2024-08-06 DIAGNOSIS — Z5329 Procedure and treatment not carried out because of patient's decision for other reasons: Secondary | ICD-10-CM

## 2024-08-06 DIAGNOSIS — M1612 Unilateral primary osteoarthritis, left hip: Secondary | ICD-10-CM

## 2024-08-06 DIAGNOSIS — Z96641 Presence of right artificial hip joint: Secondary | ICD-10-CM

## 2024-08-06 DIAGNOSIS — M47817 Spondylosis without myelopathy or radiculopathy, lumbosacral region: Secondary | ICD-10-CM

## 2024-08-06 DIAGNOSIS — N529 Male erectile dysfunction, unspecified: Secondary | ICD-10-CM

## 2024-08-06 DIAGNOSIS — F1421 Cocaine dependence, in remission: Secondary | ICD-10-CM

## 2024-08-06 NOTE — Progress Notes (Signed)
 Daily Group Progress Note   Program: CD IOP     Group Time: 9 a.m. to 12 p.m.      Type of Therapy: Process and Psychoeducational    Topic: The therapists check in with group members, assess for SI/HI/psychosis and overall level of functioning. The therapists inquire about sobriety date and number of community support meetings attended since last session.  Therapists read the daily reflection from AA today and prompted discussion on the reading. Therapists discuss the following issues and prompt discussion:  how step 4 involves looking at character defects and how doing the work of changing these is an important part of recovery, listening to how the stories of those in recovery in 12 step meetings change over time and the more invested one becomes in recovery the more honesty one finds in their story, the five rules of recovery as presented by Elspeth Rocker, PhD including 1) Change your life, 2) Be completely honest, 3) ask for help, 4) practice self-care and 5) don't bend the rules.   Summary: [name] presents today rating both his depression and anxiety as a 0.   [name] reports he has asked a male at the AA meeting if he would be his sponsor. [name] says this man says he will have to think about it, as he has never sponsored anyone with a drug addiction other than alcohol .  [name] says he will go to a meeting at noon after group and will see this man there today.    [name] reports the same sobriety date. He is attending meetings.  [name] identifies his emotions as good and excited. He says he is excited because he got a job.  [name] says the man from the agency called him and asked him to come last Monday and do a walk through to see if he was interested in the job. 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  [name] says the man who he talked to said they will train him. His work hours will be 7 am to 4 pm. [name] says he starts on his new job on next Monday so this Friday will be his last  day in CD IOP  While another peer was discussing her emotions as it relates to death anniversaries of her parents, [name] shares that his mother passed in 2016 and he has a video that his daughter made of his mother dancing in the back yard. [name] says his mother was a happy lady. He says he knows his mother would want him happy so he remembers the good times.     Progress Towards Goals: [name] reports no change in his sobriety date.  UDS collected: no Results: None   AA/NA attended?: Yes   Sponsor?:  No   [name], MS, LMFT, LCAS [address] [place], [place], [facility], LCAS  08/06/24

## 2024-08-06 NOTE — Progress Notes (Signed)
 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 He had lost his long term employment of 17 years but as of Monday with the help of Unemployment service he will start a new job next Monday. 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 He is to meet today with a man he wants to be his sponsor. He also reports that his marriage relationship has improved remarkably.  Counselor's report: Summary: [name] presents today rating both his depression and anxiety as a 0.   [name] reports he has asked a male at the AA meeting if he would be his sponsor. [name] says this man says he will have to think about it, as he has never sponsored anyone with a drug addiction other than alcohol .  [name] says he will go to a meeting at noon after group and will see this man there today.     [name] reports the same sobriety date. He is attending meetings.  [name] identifies his emotions as good and excited. He says he is excited because he got a job.  [name] says the man from the agency called him and asked him to come last Monday and do a walk through to see if he was interested in the job. 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  [name] says the man who he talked to said they will train him. His work hours will be 7 am to 4 pm. [name] says he starts on his new job on next Monday so this Friday will be his last day in CD IOP   While another peer was discussing her emotions as it relates to  death anniversaries of her parents, [name] shares that his mother passed in 2016 and he has a video that his daughter made of his mother dancing in the back yard. [name] says his mother was a happy lady. He says he knows his mother would want him happy so he remembers the good times.     Review of Systems: Psychiatric: Agitation: See Counselor report Hallucination: No Depressed Mood: see Counselor reports Insomnia: No Hypersomnia: No Altered Concentration: No Feels Worthless: No Grandiose Ideas: No Belief In Special Powers: No New/Increased Substance Abuse: No Compulsions: No cravings at present  Neurologic: Headache: No Seizure: No Paresthesias: No  Current Medications: Your Medication List albuterol  108 (90 Base) MCG/ACT inhaler Commonly known as: VENTOLIN  HFA Inhale 2 puffs into the lungs every 6 (six) hours as needed for wheezing or shortness of breath.  amLODipine  10 MG tablet Commonly known as: NORVASC  Take 1 tablet (10 mg total) by mouth daily.  colchicine  0.6 MG tablet Take 1 tablet (0.6 mg total) by mouth daily as needed (gout flares).  famotidine  20 MG tablet Commonly known as: PEPCID  Take 1 tablet (20 mg total) by mouth 2 (two) times daily.  folic acid  1 MG tablet Commonly known as: FOLVITE  Take 1 tablet by mouth once daily  meloxicam  15 MG tablet Commonly known as: MOBIC  Take 1 tablet (15 mg total) by mouth daily.  sildenafil   100 MG tablet Commonly known as: Viagra  Take 1 tablet (100 mg total) by mouth daily as needed for erectile dysfunction.    Mental Status Examination  Appearance:Casual Neat Alert: Yes Attention: good  Cooperative: Yes Eye Contact: Good Speech: Clear and coherent, rate WNL Psychomotor Activity: Normal Memory:Intact Concentration/Attention: Normal/intact Oriented: person, place, time/date and situation Mood: Euthymic Affect: Appropriate and Congruent Thought Processes and Associations: Coherent and Intact Fund of  Knowledge:WDL Thought Content:No SI HI, Logical Goal Directed Insight:Improved Judgement:Improved  LID:Rozjm  PDMP:Clear  Diagnosis:  Cocaine use disorder, severe, in early remission (HCC) History of cannabis abuse Procedure and treatment not carried out because of patient's decision for other reasons Essential hypertension, benign History of pulmonary embolus (PE) Chronic kidney disease, unspecified CKD stage Spondylosis of lumbosacral region, unspecified spinal osteoarthritis complication status Erectile dysfunction, unspecified erectile dysfunction type S/p revision of right total hip Primary osteoarthritis of left hip Chronic gout without tophus, unspecified cause, uns  Assessment:In recovery fron Crack cocaine dependence  Treatment Plan:Per admission and Counselors                             D/C Friday Erik Emmer, PA-CPatient ID: Erik L Klebba Jr., male   DOB: 11/22/1965, 58 y.o.   MRN: [id number]

## 2024-08-06 NOTE — Telephone Encounter (Signed)
 FYI Only or Action Required?: FYI only for provider: appointment scheduled on 08/07/24.  Patient was last seen in primary care on 07/14/2024 by Ozell Heron HERO, MD.  Called Nurse Triage reporting Chest Pain.  Symptoms began several weeks ago.  Interventions attempted: Nothing.  Symptoms are: unchanged.  Triage Disposition: See PCP When Office is Open (Within 3 Days)  Patient/caregiver understands and will follow disposition?:   Copied from CRM #[id number]. Topic: Clinical - Red Word Triage >> Aug 06, 2024  4:10 PM Avram MATSU wrote: Red Word that prompted transfer to Nurse Triage: chest pain   ----------------------------------------------------------------------- From previous Reason for Contact - Scheduling: Patient/patient representative is calling to schedule an appointment. Refer to attachments for appointment information. Reason for Disposition  [1] Chest pain(s) lasting a few seconds AND [2] persists > 3 days  Answer Assessment - Initial Assessment Questions Pt called in stating that he is having intermittent R chest and bilateral shoulder pain at night for the last couple weeks. States that it feels all muscular, like he had an intense workout even when he hasn't exercised. Pt states he is not taking anything for the pain. Pt does have a hx of HTN and is taking medication as directed. Based on hx and symptoms, recommended an appt with PCP. Appointment scheduled for evaluation. Patient agrees with plan of care, and will call back if anything changes, or if symptoms worsen.      1. LOCATION: Where does it hurt?       R chest/ribs; bilateral shoulders   2. RADIATION: Does the pain go anywhere else? (e.g., into neck, jaw, arms, back)     No   3. ONSET: When did the chest pain begin? (Minutes, hours or days)      A couple weeks ago   4. PATTERN: Does the pain come and go, or has it been constant since it started?  Does it get worse with exertion?      Comes and  goes; pt states it only happens at night when he lays down to go to sleep. States that as he gets up and starts moving around, pain subsides   5. DURATION: How long does it last (e.g., seconds, minutes, hours)     Throughout the night; on and off   6. SEVERITY: How bad is the pain?  (e.g., Scale 1-10; mild, moderate, or severe)     Soreness; not a true pain. Pt states it feels like he did a shoulder workout and his muscles are sore even though he did not exercise   7. CARDIAC RISK FACTORS: Do you have any history of heart problems or risk factors for heart disease? (e.g., angina, prior heart attack; diabetes, high blood pressure, high cholesterol, smoker, or strong family history of heart disease)     HTN  8. PULMONARY RISK FACTORS: Do you have any history of lung disease?  (e.g., blood clots in lung, asthma, emphysema, birth control pills)     No; pt states he was given an albuterol  inhaler but denies SOB   9. CAUSE: What do you think is causing the chest pain?     Unknown   10. OTHER SYMPTOMS: Do you have any other symptoms? (e.g., dizziness, nausea, vomiting, sweating, fever, difficulty breathing, cough)       No; denies any other symptoms  Protocols used: Chest Pain-A-AH

## 2024-08-06 NOTE — Telephone Encounter (Signed)
 Appt on 12/11 with PCP.

## 2024-08-06 NOTE — Addendum Note (Signed)
 Addended by: LEILA CARLIN BRAVO on: 08/06/2024 03:31 PM   Modules accepted: Level of Service

## 2024-08-07 ENCOUNTER — Ambulatory Visit (INDEPENDENT_AMBULATORY_CARE_PROVIDER_SITE_OTHER): Admitting: Family Medicine

## 2024-08-07 ENCOUNTER — Ambulatory Visit

## 2024-08-07 VITALS — BP 136/92 | HR 71 | Temp 98.1°F | Ht 74.0 in | Wt 244.9 lb

## 2024-08-07 DIAGNOSIS — G8929 Other chronic pain: Secondary | ICD-10-CM

## 2024-08-07 DIAGNOSIS — R062 Wheezing: Secondary | ICD-10-CM

## 2024-08-07 DIAGNOSIS — M25511 Pain in right shoulder: Secondary | ICD-10-CM

## 2024-08-07 DIAGNOSIS — R053 Chronic cough: Secondary | ICD-10-CM | POA: Diagnosis not present

## 2024-08-07 DIAGNOSIS — M1A09X0 Idiopathic chronic gout, multiple sites, without tophus (tophi): Secondary | ICD-10-CM

## 2024-08-07 MED ORDER — BUDESONIDE-FORMOTEROL FUMARATE 160-4.5 MCG/ACT IN AERO: 2.0000 | INHALATION_SPRAY | Freq: Two times a day (BID) | RESPIRATORY_TRACT | 3 refills | Status: AC

## 2024-08-07 MED ORDER — CYCLOBENZAPRINE HCL 10 MG PO TABS: 10.0000 mg | ORAL_TABLET | Freq: Three times a day (TID) | ORAL | 0 refills | Status: AC | PRN

## 2024-08-07 MED ORDER — ALLOPURINOL 100 MG PO TABS: 100.0000 mg | ORAL_TABLET | Freq: Every day | ORAL | 6 refills | Status: AC

## 2024-08-07 NOTE — Telephone Encounter (Signed)
 Noted- ok to close.

## 2024-08-07 NOTE — Progress Notes (Signed)
 Established Patient Office Visit  Subjective   Patient ID: Erik Stauber., male    DOB: 1966/04/05  Age: 58 y.o. MRN: [id number]  Chief Complaint  Patient presents with   Shoulder Pain    Pt c/o both shoulders pain. Denied injury. Sx going on for 3 months. When go bed at night, lay on back or laying on one shoulder, both shoulders hurts and stiff in the morning. Once moving it around, it is better. Taking advil . Last night used diclofenac sodium topical gel- helps with stiffness. Pain still there.    Chest Pain    Pt c/o chest pain yesterday on R side.    Wheezing    Pt updates he is still wheezing on chest when laying down.     Shoulder Pain   Chest Pain   Wheezing  Associated symptoms include chest pain.    Discussed the use of AI scribe software for clinical note transcription with the patient, who gave verbal consent to proceed.  History of Present Illness   Erik Orner. is a 58 year old male with gout who presents with bilateral shoulder pain and wheezing.  He has bilateral shoulder soreness and stiffness every night and morning, present on waking and sometimes at night, which improves with movement. Pain is not tender to touch and range of motion is intact, though lifting elbows above shoulder height causes soreness. He denies prior shoulder injuries, recent repetitive motions, or heavy lifting. He notes generalized body stiffness and wonders about arthritis.  He had a single episode of sharp chest pain once yesterday or the day before, which has not recurred. He has had nocturnal wheezing since last year, mostly when lying down, with associated cough but no fever or chills. He has no asthma or lung disease history. He received an albuterol  inhaler two days ago.  He has gout with a uric acid level of 7.7 in November and is not taking allopurinol. He stopped meloxicam , which had been prescribed for joint pain.      Current Outpatient Medications  Medication  Instructions   albuterol  (VENTOLIN  HFA) 108 (90 Base) MCG/ACT inhaler 2 puffs, Inhalation, Every 6 hours PRN   allopurinol (ZYLOPRIM) 100 mg, Oral, Daily   amLODipine  (NORVASC ) 10 mg, Oral, Daily   budesonide-formoterol (SYMBICORT) 160-4.5 MCG/ACT inhaler 2 puffs, Inhalation, 2 times daily   colchicine  0.6 mg, Oral, Daily PRN   cyclobenzaprine  (FLEXERIL ) 10 mg, Oral, 3 times daily PRN   famotidine  (PEPCID ) 20 mg, Oral, 2 times daily   folic acid  (FOLVITE ) 1 mg, Oral, Daily   meloxicam  (MOBIC ) 15 mg, Oral, Daily   sildenafil  (VIAGRA ) 100 mg, Oral, Daily PRN    Patient Active Problem List   Diagnosis Date Noted   Paresthesia and pain of both upper extremities 10/24/2022   History of pulmonary embolus (PE) 10/23/2022   Wheezing 10/23/2022   Low vision left eye category 1, normal vision right eye 06/03/2019   DJD (degenerative joint disease) 06/03/2019   Healthcare maintenance 10/21/2014   Chronic kidney disease 11/28/2012   Erectile dysfunction 05/09/2012   Essential hypertension, benign 01/27/2009     Review of Systems  Respiratory:  Positive for wheezing.   Cardiovascular:  Positive for chest pain.  All other systems reviewed and are negative.     Objective:     BP (!) 136/92 (BP Location: Right Arm, Patient Position: Sitting, Cuff Size: Large)   Pulse 71   Temp 98.1 F (36.7 C) (Oral)  Ht 6' 2 (1.88 m)   Wt 244 lb 14.4 oz (111.1 kg)   SpO2 96%   BMI 31.44 kg/m    Physical Exam Vitals reviewed.  Constitutional:      Appearance: He is well-developed and normal weight.  Cardiovascular:     Rate and Rhythm: Normal rate and regular rhythm.     Heart sounds: Normal heart sounds. No murmur heard. Pulmonary:     Effort: Pulmonary effort is normal.     Breath sounds: Wheezing (mild scattered inspiratory wheezing centrally in the chest) present.  Musculoskeletal:        General: No swelling, tenderness, deformity or signs of injury. Normal range of motion.   Neurological:     Mental Status: He is alert and oriented to person, place, and time.  Psychiatric:        Mood and Affect: Mood normal.        Behavior: Behavior normal.      No results found for any visits on 08/07/24.    The 10-year ASCVD risk score (Arnett DK, et al., 2019) is: 11.7%    Assessment & Plan:  Chronic gout of multiple sites, unspecified cause -     Allopurinol; Take 1 tablet (100 mg total) by mouth daily.  Dispense: 30 tablet; Refill: 6  Wheezing -     CT CHEST WO CONTRAST; Future -     Budesonide-Formoterol Fumarate; Inhale 2 puffs into the lungs 2 (two) times daily.  Dispense: 1 each; Refill: 3  Chronic cough -     CT CHEST WO CONTRAST; Future  Chronic right shoulder pain -     DG Shoulder Right; Future -     Cyclobenzaprine  HCl; Take 1 tablet (10 mg total) by mouth 3 (three) times daily as needed for muscle spasms.  Dispense: 30 tablet; Refill: 0   Assessment and Plan    Wheezing and chronic cough Chronic wheezing primarily at night, with no history of asthma or lung disease. Previous chest x-ray was normal. Differential includes reactive airway disease, allergies, or other lung conditions. Wheezing may be due to airway inflammation or narrowing. - Ordered CT scan of the chest to evaluate lung structure and rule out other conditions. - Prescribed Symbicort inhaler, two puffs in the morning and two puffs at night, to maintain open airways. - Continue albuterol  inhaler as a rescue inhaler for coughing fits. - Will consider referral to a pulmonologist for lung function tests if CT scan indicates further investigation is needed.  Chronic gout with polyarticular joint pain Chronic gout with elevated uric acid levels (7.7). Joint pain may be related to gout or arthritis. Previous uric acid level was 7.7, above the target of less than 7. Allopurinol was previously discussed to lower uric acid levels and prevent gout flare-ups. - Prescribed allopurinol 100 mg  daily to lower uric acid levels and prevent gout flare-ups. - Ordered x-rays of the right shoulder to assess for joint damage or erosive changes associated with gout or arthritis.  Chronic right shoulder pain and bilateral shoulder stiffness Chronic bilateral shoulder stiffness and soreness, particularly in the morning. No prior shoulder injuries. Pain is not exacerbated by touch but occurs with movement. Differential includes arthritis or gout-related joint pain. Previous rheumatoid arthritis tests were negative. - Ordered x-ray of the right shoulder to evaluate for joint damage or arthritis. - Prescribed muscle relaxer for nighttime use to alleviate morning stiffness. - Continue anti-inflammatory medication as previously prescribed.  No follow-ups on file.    Heron CHRISTELLA Sharper, MD

## 2024-08-08 ENCOUNTER — Ambulatory Visit (INDEPENDENT_AMBULATORY_CARE_PROVIDER_SITE_OTHER): Admitting: Medical

## 2024-08-08 ENCOUNTER — Encounter (HOSPITAL_COMMUNITY): Payer: Self-pay | Admitting: Medical

## 2024-08-08 ENCOUNTER — Other Ambulatory Visit (HOSPITAL_COMMUNITY): Payer: Self-pay

## 2024-08-08 DIAGNOSIS — I10 Essential (primary) hypertension: Secondary | ICD-10-CM

## 2024-08-08 DIAGNOSIS — Z96641 Presence of right artificial hip joint: Secondary | ICD-10-CM

## 2024-08-08 DIAGNOSIS — Z5329 Procedure and treatment not carried out because of patient's decision for other reasons: Secondary | ICD-10-CM

## 2024-08-08 DIAGNOSIS — M47817 Spondylosis without myelopathy or radiculopathy, lumbosacral region: Secondary | ICD-10-CM

## 2024-08-08 DIAGNOSIS — M1A9XX0 Chronic gout, unspecified, without tophus (tophi): Secondary | ICD-10-CM

## 2024-08-08 DIAGNOSIS — Z86711 Personal history of pulmonary embolism: Secondary | ICD-10-CM

## 2024-08-08 DIAGNOSIS — N189 Chronic kidney disease, unspecified: Secondary | ICD-10-CM

## 2024-08-08 DIAGNOSIS — N529 Male erectile dysfunction, unspecified: Secondary | ICD-10-CM

## 2024-08-08 DIAGNOSIS — F1211 Cannabis abuse, in remission: Secondary | ICD-10-CM

## 2024-08-08 DIAGNOSIS — F1421 Cocaine dependence, in remission: Secondary | ICD-10-CM

## 2024-08-08 DIAGNOSIS — M1612 Unilateral primary osteoarthritis, left hip: Secondary | ICD-10-CM

## 2024-08-08 DIAGNOSIS — F1420 Cocaine dependence, uncomplicated: Secondary | ICD-10-CM

## 2024-08-08 NOTE — Progress Notes (Signed)
 [facility] CD IOP                                                                                Discharge Summary   Date of Admission: 05/26/2024 Referall Source:   [facility]                                                                      Date of Discharge:08/08/2024 Sobriety Date: 05/14/2024 Admission Diagnosis: 1. Cocaine use disorder, severe, dependence (HCC)  F14.20       2. History of cannabis abuse  F12.11       3. Essential hypertension, benign  I10       4. History of pulmonary embolus (PE)  Z86.711       5. Chronic kidney disease, unspecified CKD stage  N18.9       6. Spondylosis of lumbosacral region, unspecified spinal osteoarthritis complication status  M47.817       7. Erectile dysfunction, unspecified erectile dysfunction type  N52.9       8. S/p revision of right total hip  Z96.641       9. Primary osteoarthritis of left hip  M16.12       10. 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 He had lost his long term employment of 17 years but as of Monday with the help of Unemployment service he will start a new job next Monday. 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 He is to meet today with a man he wants to be his sponsor. He also reports that his marriage relationship has improved remarkably.    Medications: Current Medications: Your Medication List albuterol  108 (90 Base) MCG/ACT inhaler Commonly known as: VENTOLIN  HFA Inhale 2 puffs into the  lungs every 6 (six) hours as needed for wheezing or shortness of breath.  amLODipine  10 MG tablet Commonly known as: NORVASC  Take 1 tablet (10 mg total) by mouth daily.  colchicine  0.6 MG tablet Take 1 tablet (0.6 mg total) by mouth daily as needed (gout flares).  famotidine  20 MG tablet Commonly known as: PEPCID  Take 1 tablet (20 mg total) by mouth 2 (two) times daily.  folic acid  1 MG tablet Commonly known as: FOLVITE  Take 1 tablet by mouth once daily  meloxicam  15 MG tablet Commonly known as: MOBIC  Take 1 tablet (15 mg total) by mouth daily.  sildenafil  100 MG tablet Commonly known as: Viagra  Take 1 tablet (100 mg total) by mouth daily as needed for erectile  dysfunction.      Discharge Diagnosis:                                                                                Cocaine use disorder, severe, in early remission (HCC) History of cannabis abuse Procedure and treatment not carried out because of patient's decision for other reasons Essential hypertension, benign History of pulmonary embolus (PE) Chronic kidney disease, unspecified CKD stage Spondylosis of lumbosacral region, unspecified spinal osteoarthritis complication status Erectile dysfunction, unspecified erectile dysfunction type S/p revision of right total hip Primary osteoarthritis of left hip Chronic gout without tophus, unspecified cause, unspecified site Cocaine use disorder, severe, dependence (HCC)  Plan of Action to Address Continuing Problems:  Goals and Activities to Help Maintain Sobriety: Stay away from people ,places and things that are triggers Continue practicing Fair Fighting rules in interpersonal conflicts. Continue alcohol  and drug refusal skills and call on support system  Attend AA/NA meetings AT LEAST as often as you use  Obtain a sponsor and a home group in AA/NA. Return to Providers and support groups of choice  Referrals:  Aftercare:AA/NA Medication management:NA PCP Other:PDMP  Clear  Next appointment: Pt to schedule   Prognosis:Excellent if he continues in recovery Dismal if not    Client has participated in the development of this discharge plan and has received a copy of this completed plan  Patient ID: [name]., male   DOB: 08/31/1965, 58 y.o.   MRN: [id number]

## 2024-08-08 NOTE — Addendum Note (Signed)
 Addended by: LEILA CARLIN BRAVO on: 08/08/2024 08:19 PM   Modules accepted: Level of Service

## 2024-08-08 NOTE — Progress Notes (Signed)
 Daily Group Progress Note   Program: [facility] IOP     Group Time: 9 a.m. to 12 p.m.      Type of Therapy: Process and Psychoeducational    Topic: The therapists check in with group members, assess for SI/HI/psychosis and overall level of functioning. The therapists inquire about sobriety date and number of community support meetings attended since last session.  The therapists have group members watch and discuss a video aimed at helping them to realize that there is no such thing as a hard versus a soft drugs. The therapists present information illustrating the reason that attempting to be [place]  sober does not work. The therapists continue to illustrate the pitfalls of having relationships with others who have some form of active addiction. In addition to these people serving as triggers, they people tend to not mature emotionally and have a great deal of chaos in their lives in which others in their orbit can become entangled. The therapists discuss the importance of limit setting in dealing with child and adolescent drug use and the importance of modeling sobriety as an example to these kids. The therapists suggest that it is prudent to listen to addicts in recovery who have years of sobriety when they say to not do something as if they have not found a way over, under, or around something; then neither will the person to whom they are directing this advice.  Summary: [name] presents today rating both his depression and anxiety as a 0.   This is [name]'s last group today as he is beginning a new job on this Monday and his hours will be daytime hours. [name] is attending meetings and he says he now has an Clinical Research Associate. He says his sponsor said he also wants him to get a NA sponsor as the AA sponsor says he has never sponsored someone with a Cocaine addiction.  [name] says he feels like he is going out with a bang since he got a sponsor. [name] reports he and his sponsor are going to meet early  next week, either on Monday or Tuesday.  [name] identifies his emotions as good and excited. He says he is excited because of getting a new job and that he got a sponsor. [name] says he feels like he has things in place so he can move forward with his life. [name] reports he is not having any urges or cravings to use Cocaine.   [name] had some questions about the research presented today and therapists explained that though AA and NA made not have had the scientific research many years ago but they know this information based on their lived experience.   [name]'s peer and the therapists wish him well. [name] will call and check in with therapists and will let us  know if his schedule will allow for individual therapy.  Progress Towards Goals: [name] reports no change in his sobriety date.  UDS collected: no Results: None   AA/NA attended?: Yes   Sponsor?:  Yes  [name], MS, LMFT, LCAS [address], [place], [place], [facility], LCAS  08/08/24

## 2024-08-11 ENCOUNTER — Encounter: Payer: Self-pay | Admitting: Family Medicine

## 2024-08-11 ENCOUNTER — Ambulatory Visit (HOSPITAL_COMMUNITY)

## 2024-08-13 ENCOUNTER — Ambulatory Visit (HOSPITAL_COMMUNITY)

## 2024-08-15 ENCOUNTER — Ambulatory Visit (HOSPITAL_COMMUNITY)

## 2024-08-18 ENCOUNTER — Ambulatory Visit (HOSPITAL_COMMUNITY)

## 2024-08-18 ENCOUNTER — Ambulatory Visit: Payer: Self-pay | Admitting: Family Medicine

## 2024-08-18 ENCOUNTER — Inpatient Hospital Stay: Admission: RE | Admit: 2024-08-18 | Discharge: 2024-08-18 | Attending: Family Medicine

## 2024-08-18 DIAGNOSIS — R062 Wheezing: Secondary | ICD-10-CM

## 2024-08-18 DIAGNOSIS — R053 Chronic cough: Secondary | ICD-10-CM

## 2024-08-22 ENCOUNTER — Ambulatory Visit (HOSPITAL_COMMUNITY)

## 2024-08-25 ENCOUNTER — Encounter: Admitting: Family Medicine

## 2024-08-25 ENCOUNTER — Ambulatory Visit (HOSPITAL_COMMUNITY)

## 2024-08-27 ENCOUNTER — Ambulatory Visit (HOSPITAL_COMMUNITY)

## 2024-09-24 ENCOUNTER — Ambulatory Visit: Payer: Self-pay

## 2024-09-24 ENCOUNTER — Ambulatory Visit (INDEPENDENT_AMBULATORY_CARE_PROVIDER_SITE_OTHER): Payer: MEDICAID | Admitting: Family Medicine

## 2024-09-24 ENCOUNTER — Encounter: Payer: Self-pay | Admitting: Family Medicine

## 2024-09-24 VITALS — BP 164/96 | HR 67 | Temp 98.1°F | Wt 248.7 lb

## 2024-09-24 DIAGNOSIS — R42 Dizziness and giddiness: Secondary | ICD-10-CM | POA: Diagnosis not present

## 2024-09-24 DIAGNOSIS — I10 Essential (primary) hypertension: Secondary | ICD-10-CM

## 2024-09-24 MED ORDER — MECLIZINE HCL 25 MG PO TABS: 25.0000 mg | ORAL_TABLET | Freq: Three times a day (TID) | ORAL | 0 refills | Status: AC | PRN

## 2024-09-24 NOTE — Telephone Encounter (Signed)
 Appt today with Dr Micheal.
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
## 2024-09-24 NOTE — Patient Instructions (Signed)
 Get back on your Amlodipine  daily.

## 2024-09-24 NOTE — Telephone Encounter (Signed)
" °  FYI Only or Action Required?: FYI only for provider: appointment scheduled on 1/28.  Patient was last seen in primary care on 08/07/2024 by Erik Heron HERO, MD.  Called Nurse Triage reporting Dizziness.  Symptoms began several days ago.  Interventions attempted: Nothing.  Symptoms are: stable.  Triage Disposition: See Physician Within 24 Hours  Patient/caregiver understands and will follow disposition?: Yes    Reason for Triage: dizziness x 2 days,more at night    Reason for Disposition  [1] MODERATE dizziness (e.g., interferes with normal activities) AND [2] has NOT been evaluated by doctor (or NP/PA) for this  (Exception: Dizziness caused by heat exposure, sudden standing, or poor fluid intake.)  Answer Assessment - Initial Assessment Questions 1. DESCRIPTION: Describe your dizziness.     Yeah like the room is spinning.   2. LIGHTHEADED: Do you feel lightheaded? (e.g., somewhat faint, woozy, weak upon standing)     Yes  3. VERTIGO: Do you feel like either you or the room is spinning or tilting? (i.e., vertigo)     Yes  4. SEVERITY: How bad is it?  Do you feel like you are going to faint? Can you stand and walk?     Yes I can sit up and walk and all that, it never hit me when I walk, when I bend down and raise up yeah.   5. ONSET:  When did the dizziness begin?     2 days  6. AGGRAVATING FACTORS: Does anything make it worse? (e.g., standing, change in head position)     Change in position  8. CAUSE: What do you think is causing the dizziness? (e.g., decreased fluids or food, diarrhea, emotional distress, heat exposure, new medicine, sudden standing, vomiting; unknown)     Denies  9. RECURRENT SYMPTOM: Have you had dizziness before? If Yes, ask: When was the last time? What happened that time?     Denies  10. OTHER SYMPTOMS: Do you have any other symptoms? (e.g., fever, chest pain, vomiting, diarrhea, bleeding) Mild small  HA  Protocols used: Dizziness - Lightheadedness-A-AH  "

## 2025-01-12 ENCOUNTER — Ambulatory Visit: Admitting: Family Medicine
